# Patient Record
Sex: Female | Born: 1940 | Race: White | Hispanic: No | Smoking: Former smoker
Health system: Southern US, Community
[De-identification: ages and names within clinical notes are randomized; demographics above are authoritative.]

## PROBLEM LIST (undated history)

## (undated) DIAGNOSIS — Z800 Family history of malignant neoplasm of digestive organs: Secondary | ICD-10-CM

## (undated) DIAGNOSIS — I119 Hypertensive heart disease without heart failure: Secondary | ICD-10-CM

## (undated) DIAGNOSIS — I10 Essential (primary) hypertension: Secondary | ICD-10-CM

## (undated) DIAGNOSIS — R011 Cardiac murmur, unspecified: Secondary | ICD-10-CM

## (undated) DIAGNOSIS — I6521 Occlusion and stenosis of right carotid artery: Secondary | ICD-10-CM

## (undated) DIAGNOSIS — Z8719 Personal history of other diseases of the digestive system: Secondary | ICD-10-CM

## (undated) DIAGNOSIS — K219 Gastro-esophageal reflux disease without esophagitis: Secondary | ICD-10-CM

## (undated) DIAGNOSIS — Z8639 Personal history of other endocrine, nutritional and metabolic disease: Secondary | ICD-10-CM

## (undated) DIAGNOSIS — Z803 Family history of malignant neoplasm of breast: Secondary | ICD-10-CM

## (undated) HISTORY — DX: Family history of malignant neoplasm of digestive organs: Z80.0

## (undated) HISTORY — DX: Family history of malignant neoplasm of breast: Z80.3

---

## 2015-11-23 HISTORY — PX: CAROTID ENDARTERECTOMY: SUR193

## 2017-04-30 HISTORY — PX: COLONOSCOPY: SHX174

## 2020-03-02 ENCOUNTER — Other Ambulatory Visit: Payer: Self-pay | Admitting: General Surgery

## 2020-03-02 ENCOUNTER — Ambulatory Visit: Payer: Self-pay | Admitting: General Surgery

## 2020-03-02 DIAGNOSIS — Z170 Estrogen receptor positive status [ER+]: Secondary | ICD-10-CM

## 2020-03-02 DIAGNOSIS — C50411 Malignant neoplasm of upper-outer quadrant of right female breast: Secondary | ICD-10-CM

## 2020-03-03 ENCOUNTER — Other Ambulatory Visit: Payer: Self-pay | Admitting: General Surgery

## 2020-03-03 DIAGNOSIS — Z800 Family history of malignant neoplasm of digestive organs: Secondary | ICD-10-CM

## 2020-03-04 ENCOUNTER — Other Ambulatory Visit: Payer: Self-pay | Admitting: General Surgery

## 2020-03-04 ENCOUNTER — Telehealth: Payer: Self-pay | Admitting: Hematology and Oncology

## 2020-03-04 DIAGNOSIS — C50411 Malignant neoplasm of upper-outer quadrant of right female breast: Secondary | ICD-10-CM

## 2020-03-04 DIAGNOSIS — Z170 Estrogen receptor positive status [ER+]: Secondary | ICD-10-CM

## 2020-03-04 NOTE — Telephone Encounter (Signed)
Received referrals for genetics and med onc for dx of breast cancer. Julia Fernandez has been cld and scheduled to see Dr. Lindi Adie on 12/1 at 1pm an White for genetics at 2pm.

## 2020-03-09 NOTE — Progress Notes (Addendum)
[place] [address] [place] [address] Phone: [phone] Fax: [phone]      Your procedure is scheduled on 03/17/20.  Report to [facility] Main Entrance "A" at 10:30 A.M., and check in at the Admitting office.  Call this number if you have problems the morning of surgery:  [phone]  Call [phone] if you have any questions prior to your surgery date Monday-Friday 8am-4pm    You may drink clear liquids until 9:00am the morning of your surgery.   Clear liquids allowed are: Water, Non-Citrus Juices (without pulp), Carbonated Beverages, Clear Tea, Black Coffee Only, and Gatorade     Take these medicines the morning of surgery with A SIP OF WATER  Amlodipine (norvasc) Metoprolol (lopressor) Rosuvastatin (crestor)  .Follow your surgeon's instructions on when to stop Aspirin.  If no instructions were given by your surgeon then you will need to call the office to get those instructions.     As of today, STOP taking any Aleve, Naproxen, Ibuprofen, Motrin, Advil, Goody's, BC's, all herbal medications, fish oil, and all vitamins.                      Do not wear jewelry, make up, or nail polish            Do not wear lotions, powders, perfumes/colognes, or deodorant.            Do not shave 48 hours prior to surgery.             Do not bring valuables to the hospital.            [facility] is not responsible for any belongings or valuables.  Do NOT Smoke (Tobacco/Vaping) or drink Alcohol 24 hours prior to your procedure If you use a CPAP at night, you may bring all equipment for your overnight stay.   Contacts, glasses, dentures or bridgework may not be worn into surgery.      For patients admitted to the hospital, discharge time will be determined by your treatment team.   Patients discharged the day of surgery will not be allowed to drive home, and someone needs to stay with them for 24  hours.    Special instructions:   [place]- Preparing For Surgery  Before surgery, you can play an important role. Because skin is not sterile, your skin needs to be as free of germs as possible. You can reduce the number of germs on your skin by washing with CHG (chlorahexidine gluconate) Soap before surgery.  CHG is an antiseptic cleaner which kills germs and bonds with the skin to continue killing germs even after washing.    Oral Hygiene is also important to reduce your risk of infection.  Remember - BRUSH YOUR TEETH THE MORNING OF SURGERY WITH YOUR REGULAR TOOTHPASTE  Please do not use if you have an allergy to CHG or antibacterial soaps. If your skin becomes reddened/irritated stop using the CHG.  Do not shave (including legs and underarms) for at least 48 hours prior to first CHG shower. It is OK to shave your face.  Please follow these instructions carefully.   1. Shower the NIGHT BEFORE SURGERY and the MORNING OF SURGERY with CHG Soap.   2. If you chose to wash your hair, wash your hair first as usual with your normal shampoo.  3. After you shampoo, rinse your hair  and body thoroughly to remove the shampoo.  4. Use CHG as you would any other liquid soap. You can apply CHG directly to the skin and wash gently with a scrungie or a clean washcloth.   5. Apply the CHG Soap to your body ONLY FROM THE NECK DOWN.  Do not use on open wounds or open sores. Avoid contact with your eyes, ears, mouth and genitals (private parts). Wash Face and genitals (private parts)  with your normal soap.   6. Wash thoroughly, paying special attention to the area where your surgery will be performed.  7. Thoroughly rinse your body with warm water from the neck down.  8. DO NOT shower/wash with your normal soap after using and rinsing off the CHG Soap.  9. Pat yourself dry with a CLEAN TOWEL.  10. Wear CLEAN PAJAMAS to bed the night before surgery  11. Place CLEAN SHEETS on your bed the night of  your first shower and DO NOT SLEEP WITH PETS.   Day of Surgery: Wear Clean/Comfortable clothing the morning of surgery Do not apply any deodorants/lotions.   Remember to brush your teeth WITH YOUR REGULAR TOOTHPASTE.   Please read over the following fact sheets that you were given.

## 2020-03-10 ENCOUNTER — Other Ambulatory Visit: Payer: Self-pay

## 2020-03-10 ENCOUNTER — Encounter (HOSPITAL_COMMUNITY)
Admission: RE | Admit: 2020-03-10 | Discharge: 2020-03-10 | Disposition: A | Discharge status: Home / Self Care | Payer: Medicare Other | Source: Ambulatory Visit | Attending: General Surgery | Admitting: General Surgery

## 2020-03-10 ENCOUNTER — Encounter (HOSPITAL_COMMUNITY): Payer: Self-pay

## 2020-03-10 DIAGNOSIS — I10 Essential (primary) hypertension: Secondary | ICD-10-CM | POA: Diagnosis not present

## 2020-03-10 DIAGNOSIS — Z87891 Personal history of nicotine dependence: Secondary | ICD-10-CM | POA: Insufficient documentation

## 2020-03-10 DIAGNOSIS — R011 Cardiac murmur, unspecified: Secondary | ICD-10-CM | POA: Diagnosis not present

## 2020-03-10 DIAGNOSIS — C50911 Malignant neoplasm of unspecified site of right female breast: Secondary | ICD-10-CM | POA: Insufficient documentation

## 2020-03-10 DIAGNOSIS — Z7982 Long term (current) use of aspirin: Secondary | ICD-10-CM | POA: Diagnosis not present

## 2020-03-10 DIAGNOSIS — K449 Diaphragmatic hernia without obstruction or gangrene: Secondary | ICD-10-CM | POA: Insufficient documentation

## 2020-03-10 DIAGNOSIS — Z79899 Other long term (current) drug therapy: Secondary | ICD-10-CM | POA: Diagnosis not present

## 2020-03-10 DIAGNOSIS — Z7901 Long term (current) use of anticoagulants: Secondary | ICD-10-CM | POA: Insufficient documentation

## 2020-03-10 DIAGNOSIS — E785 Hyperlipidemia, unspecified: Secondary | ICD-10-CM | POA: Diagnosis not present

## 2020-03-10 DIAGNOSIS — Z01818 Encounter for other preprocedural examination: Secondary | ICD-10-CM | POA: Insufficient documentation

## 2020-03-10 DIAGNOSIS — I6521 Occlusion and stenosis of right carotid artery: Secondary | ICD-10-CM | POA: Diagnosis not present

## 2020-03-10 HISTORY — DX: Personal history of other diseases of the digestive system: Z87.19

## 2020-03-10 HISTORY — DX: Hypertensive heart disease without heart failure: I11.9

## 2020-03-10 HISTORY — DX: Gastro-esophageal reflux disease without esophagitis: K21.9

## 2020-03-10 HISTORY — DX: Occlusion and stenosis of right carotid artery: I65.21

## 2020-03-10 HISTORY — DX: Personal history of other endocrine, nutritional and metabolic disease: Z86.39

## 2020-03-10 HISTORY — DX: Essential (primary) hypertension: I10

## 2020-03-10 HISTORY — DX: Cardiac murmur, unspecified: R01.1

## 2020-03-10 LAB — COMPREHENSIVE METABOLIC PANEL
ALT: 17 U/L (ref 0–44)
AST: 25 U/L (ref 15–41)
Albumin: 4.3 g/dL (ref 3.5–5.0)
Alkaline Phosphatase: 37 U/L — ABNORMAL LOW (ref 38–126)
Anion gap: 13 (ref 5–15)
BUN: 20 mg/dL (ref 8–23)
CO2: 25 mmol/L (ref 22–32)
Calcium: 10 mg/dL (ref 8.9–10.3)
Chloride: 95 mmol/L — ABNORMAL LOW (ref 98–111)
Creatinine, Ser: 1.31 mg/dL — ABNORMAL HIGH (ref 0.44–1.00)
GFR, Estimated: 42 mL/min — ABNORMAL LOW (ref >60)
Glucose, Bld: 120 mg/dL — ABNORMAL HIGH (ref 70–99)
Potassium: 3.2 mmol/L — ABNORMAL LOW (ref 3.5–5.1)
Sodium: 133 mmol/L — ABNORMAL LOW (ref 135–145)
Total Bilirubin: 0.8 mg/dL (ref 0.3–1.2)
Total Protein: 7.3 g/dL (ref 6.5–8.1)

## 2020-03-10 LAB — CBC WITH DIFFERENTIAL/PLATELET
Abs Immature Granulocytes: 0.05 10*3/uL (ref 0.00–0.07)
Basophils Absolute: 0.1 10*3/uL (ref 0.0–0.1)
Basophils Relative: 1 %
Eosinophils Absolute: 0.2 10*3/uL (ref 0.0–0.5)
Eosinophils Relative: 2 %
HCT: 38.1 % (ref 36.0–46.0)
Hemoglobin: 12.9 g/dL (ref 12.0–15.0)
Immature Granulocytes: 1 %
Lymphocytes Relative: 21 %
Lymphs Abs: 1.9 10*3/uL (ref 0.7–4.0)
MCH: 29.6 pg (ref 26.0–34.0)
MCHC: 33.9 g/dL (ref 30.0–36.0)
MCV: 87.4 fL (ref 80.0–100.0)
Monocytes Absolute: 0.8 10*3/uL (ref 0.1–1.0)
Monocytes Relative: 9 %
Neutro Abs: 6 10*3/uL (ref 1.7–7.7)
Neutrophils Relative %: 66 %
Platelets: 281 10*3/uL (ref 150–400)
RBC: 4.36 MIL/uL (ref 3.87–5.11)
RDW: 14.6 % (ref 11.5–15.5)
WBC: 8.9 10*3/uL (ref 4.0–10.5)
nRBC: 0 % (ref 0.0–0.2)

## 2020-03-10 LAB — PROTIME-INR
INR: 1 (ref 0.8–1.2)
Prothrombin Time: 12.7 seconds (ref 11.4–15.2)

## 2020-03-10 NOTE — H&P (Signed)
Julia Fernandez Appointment: 02/29/2020 11:45 AM Location: [facility] Patient #: [id number] DOB: 09/21/1940 Widowed / Language: Cleophus Molt / Race: White Female   History of Present Illness Stark Klein MD; 02/29/2020 12:56 PM) The patient is a 79 year old female who presents with breast cancer. Pt is a 79 yo F who presents with right breast cancer 01/2020. She palpated a mass in late september 2021. Diagnostic imaging showed an 11 mm asymmetrical spiculated density at 12 o'clock around 4 cm from the nipple. 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 She denies breast pain. She hasn't had issues with her breasts before.  She has two sisters who have had breast cancer, a sister with pancreatic cancer, and a niece with colon cancer. She had menarche at age 89 and menopause in her late 25s. She is a G2P2 with first child in early 56s.   Of note, she lives in [place] and her neighbor is one of my patients, Judd Gaudier.   Imaging is from [facility] in [place]. Images and reports reviewed. Disc of images in turned into the Breast Center for seed placement.    Past Surgical History Emeline Gins, [place]; 02/29/2020 12:39 PM) Breast Biopsy  Right. Carotid Artery Surgery  Right. Colon Polyp Removal - Colonoscopy   Diagnostic Studies History Emeline Gins, [place]; 02/29/2020 12:39 PM) Colonoscopy  1-5 years ago Mammogram  within last year Pap Smear  >5 years ago  Allergies Emeline Gins, [place]; 02/29/2020 11:23 AM) Codeine Phosphate *ANALGESICS - OPIOID*  Allergies Reconciled   Medication History Emeline Gins, CMA; 02/29/2020 11:25 AM) hydroCHLOROthiazide (12.5MG  Tablet, Oral) Active. amLODIPine Besylate (10MG  Tablet, Oral) Active. Metoprolol Tartrate (25MG  Tablet, Oral) Active. Aspirin (81MG  Tablet, Oral) Active. Rosuvastatin Calcium (40MG  Tablet, Oral) Active. Losartan Potassium (50MG   Tablet, Oral) Active. Vitamin D (50000U Tablet, Oral) Active. Medications Reconciled  Social History Emeline Gins, [place]; 02/29/2020 12:39 PM) Alcohol use  Occasional alcohol use. Caffeine use  Carbonated beverages, Coffee, Tea. No drug use  Tobacco use  Former smoker.  Family History Emeline Gins, [place]; 02/29/2020 12:39 PM) Breast Cancer  Sister. Colon Cancer  Family Members In General. Hypertension  Mother, Sister. Malignant Neoplasm Of Pancreas  Sister.  Pregnancy / Birth History Emeline Gins, [place]; 02/29/2020 12:39 PM) Age at menarche  33 years. Age of menopause  50-50 Gravida  2 Irregular periods  Maternal age  2-25 Para  2  Other Problems Emeline Gins, [place]; 02/29/2020 12:39 PM) Breast Cancer  Gastroesophageal Reflux Disease  Heart murmur  High blood pressure     Review of Systems Emeline Gins CMA; 02/29/2020 12:39 PM) General Not Present- Appetite Loss, Chills, Fatigue, Fever, Night Sweats, Weight Gain and Weight Loss. Skin Not Present- Change in Wart/Mole, Dryness, Hives, Jaundice, New Lesions, Non-Healing Wounds, Rash and Ulcer. HEENT Not Present- Earache, Hearing Loss, Hoarseness, Nose Bleed, Oral Ulcers, Ringing in the Ears, Seasonal Allergies, Sinus Pain, Sore Throat, Visual Disturbances, Wears glasses/contact lenses and Yellow Eyes. Respiratory Not Present- Bloody sputum, Chronic Cough, Difficulty Breathing, Snoring and Wheezing. Breast Present- Breast Mass. Not Present- Breast Pain, Nipple Discharge and Skin Changes. Cardiovascular Not Present- Chest Pain, Difficulty Breathing Lying Down, Leg Cramps, Palpitations, Rapid Heart Rate, Shortness of Breath and Swelling of Extremities. Gastrointestinal Not Present- Abdominal Pain, Bloating, Bloody Stool, Change in Bowel Habits, Chronic diarrhea, Constipation, Difficulty Swallowing, Excessive gas, Gets full quickly at meals, Hemorrhoids, Indigestion, Nausea, Rectal Pain and  Vomiting. Female Genitourinary  Not Present- Frequency, Nocturia, Painful Urination, Pelvic Pain and Urgency. Musculoskeletal Not Present- Back Pain, Joint Pain, Joint Stiffness, Muscle Pain, Muscle Weakness and Swelling of Extremities. Neurological Not Present- Decreased Memory, Fainting, Headaches, Numbness, Seizures, Tingling, Tremor, Trouble walking and Weakness. Psychiatric Not Present- Anxiety, Bipolar, Change in Sleep Pattern, Depression, Fearful and Frequent crying. Endocrine Not Present- Cold Intolerance, Excessive Hunger, Hair Changes, Heat Intolerance, Hot flashes and New Diabetes. Hematology Not Present- Blood Thinners, Easy Bruising, Excessive bleeding, Gland problems, HIV and Persistent Infections.  Vitals Emeline Gins CMA; 02/29/2020 11:23 AM) 02/29/2020 11:22 AM Weight: 148.2 lb Height: 63in Body Surface Area: 1.7 m Body Mass Index: 26.25 kg/m  Temp.: 66F  Pulse: 82 (Regular)  BP: 140/78(Sitting, Left Arm, Standard)       Physical Exam Stark Klein MD; 02/29/2020 12:57 PM) General Mental Status-Alert. General Appearance-Consistent with stated age. Hydration-Well hydrated. Voice-Normal.  Head and Neck Head-normocephalic, atraumatic with no lesions or palpable masses. Trachea-midline. Thyroid Gland Characteristics - normal size and consistency.  Eye Eyeball - Bilateral-Extraocular movements intact. Sclera/Conjunctiva - Bilateral-No scleral icterus.  Chest and Lung Exam Chest and lung exam reveals -quiet, even and easy respiratory effort with no use of accessory muscles and on auscultation, normal breath sounds, no adventitious sounds and normal vocal resonance. Inspection Chest Wall - Normal. Back - normal.  Breast Note: breasts are relatively symmetric other than the right breast is slightly larger. no nipple retraction is present. No lymphedema is seen. no LAD. no skin dimpling. Right breast has a vaguely palpable density  when laying down, but this is not clearly identifiable while sitting up.   Cardiovascular Cardiovascular examination reveals -normal heart sounds, regular rate and rhythm with no murmurs and normal pedal pulses bilaterally.  Abdomen Inspection Inspection of the abdomen reveals - No Hernias. Palpation/Percussion Palpation and Percussion of the abdomen reveal - Soft, Non Tender, No Rebound tenderness, No Rigidity (guarding) and No hepatosplenomegaly. Auscultation Auscultation of the abdomen reveals - Bowel sounds normal.  Neurologic Neurologic evaluation reveals -alert and oriented x 3 with no impairment of recent or remote memory. Mental Status-Normal.  Musculoskeletal Global Assessment -Note: no gross deformities.  Normal Exam - Left-Upper Extremity Strength Normal and Lower Extremity Strength Normal. Normal Exam - Right-Upper Extremity Strength Normal and Lower Extremity Strength Normal.  Lymphatic Head & Neck  General Head & Neck Lymphatics: Bilateral - Description - Normal. Axillary  General Axillary Region: Bilateral - Description - Normal. Tenderness - Non Tender. Femoral & Inguinal  Generalized Femoral & Inguinal Lymphatics: Bilateral - Description - No Generalized lymphadenopathy.    Assessment & Plan Stark Klein MD; 02/29/2020 12:59 PM) MALIGNANT NEOPLASM OF UPPER-OUTER QUADRANT OF RIGHT BREAST IN FEMALE, ESTROGEN RECEPTOR POSITIVE (C50.411) Impression: Pt is a good candidate for right lumpectomy with sentinel node biopsy. Given that the mass is only vaguely palpable, I will request seed placement pre op.  She and her daughter are going to consider whether they want to see oncology and radiation in [place] or closer to home.  The surgical procedure was described to the patient. I discussed the incision type and location and that we would need radiology involved on with a wire or seed marker and/or sentinel node.  The risks and benefits of the procedure  were described to the patient and she wishes to proceed.  We discussed the risks bleeding, infection, damage to other structures, need for further procedures/surgeries. We discussed the risk of seroma. The patient was advised if the area in the breast in cancer,  we may need to go back to surgery for additional tissue to obtain negative margins or for a lymph node biopsy. The patient was advised that these are the most common complications, but that others can occur as well. They were advised against taking aspirin or other anti-inflammatory agents/blood thinners the week before surgery. Current Plans Pt Education - flb breast cancer surgery: discussed with patient and provided information. You are being scheduled for surgery- Our schedulers will call you.  You should hear from our office's scheduling department within 5 working days about the location, date, and time of surgery. We try to make accommodations for patient's preferences in scheduling surgery, but sometimes the OR schedule or the surgeon's schedule prevents [place] from making those accommodations.  If you have not heard from our office [phone]) in 5 working days, call the office and ask for your surgeon's nurse.  If you have other questions about your diagnosis, plan, or surgery, call the office and ask for your surgeon's nurse.  FAMILY HISTORY OF PANCREATIC CANCER (Z80.0) Impression: Given first degree relative with pancreatic cancer, I will get cross sectional imaging. Current Plans MR ABDOMEN WO/W CON ([id number]) (Sister with pancreatic cancer) FAMILY HISTORY OF BREAST CANCER (Z80.3) Impression: Refer for genetic counseling. Current Plans Referred to Genetic Counseling, for evaluation and follow up (Medical Genetics). Routine.   Signed electronically by Stark Klein, MD (02/29/2020 12:59 PM)

## 2020-03-10 NOTE — Progress Notes (Signed)
PCP: Dr. Quillian Quince Addis--saw this month Cardiologist: Currently sees Dr. Sabra Heck in [place], [place]  EKG: Today CXR: n/a ECHO: within the last year and a half--faxed request of records to Dr. Sabra Heck and Dr. Alroy Dust as pt can't remember who performed her ECHO Stress Test: denies Cardiac Cath: denies  ASA: told to stop 5 day prior to surgery  Patient denies shortness of breath, fever, cough, and chest pain at PAT appointment.  Patient verbalized understanding of instructions provided today at the PAT appointment.  Patient asked to review instructions at home and day of surgery.   Called Ebony Hail PA-C with anesthesia during appt--does not need to see today during appt, will follow-up with records once they arrive--also made aware of elevated blood pressure. Pt stated she did take her morning meds today and normally runs between 092-330 systolic at home.

## 2020-03-11 ENCOUNTER — Encounter (HOSPITAL_COMMUNITY): Payer: Self-pay

## 2020-03-11 NOTE — Progress Notes (Addendum)
Anesthesia Chart Review:  Case: [id number] Date/Time: 03/17/20 1215   Procedure: RIGHT BREAST LUMPECTOMY WITH RADIOACTIVE SEED AND SENTINEL LYMPH NODE BIOPSY (Right Breast) - PEC BLOCK, RNFA   Anesthesia type: General   Pre-op diagnosis: RIGHT BREAST CANCER   Location: [place] OR ROOM 09 / [place] OR   Surgeons: Stark Klein, MD      DISCUSSION: Patient is a 79 year old female scheduled for the above procedure.  History includes former smoker (quit 03/11/79), HTN, HLD, murmur, GERD, hiatal hernia, carotid artery stenosis (s/p right carotid endarterectomy 11/23/15), right breast cancer.   BP elevated at PAT. 178/72 --> 190/65. She reports she had taken BP medications that morning, but reports home SBP readings run ~ 140-150's. She denied chest pain and SOB. She was advised to recheck her BP at home and then periodically between now and surgery and contact her PCP or cardiologist if persistently elevated readings.   She reported history of murmur with ongoing cardiology follow-up and echo done within the past 2 years.  Reportedly saw the NP at [facility] within the last few months. Records requested, but are pending. She denied chest pain, cough, fever, SOB.   She is to told ASA 5 days prior to surgery.  Presurgical COVID-19 test is scheduled for 03/14/20. RSL is scheduled for 03/16/20.   Chart will be left for follow-up cardiology records.    ADDENDUM 03/14/20 11:30 AM: [facility] records received. She was last seen there by Carrolyn Leigh, MD in 12/16/15 for HTN follow-up. 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 She is now followed at [facility], and those records are still pending.    ADDENDUM 03/15/20 1:01 PM: 2020 echo and 09/30/19 EKG received from Dr. Ammie Ferrier office. 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 Office note from 01/06/20 reviewed. BP 170/68 with recheck 158/68. Notes  suggests recent increase in amlodipine, and addition of HCTZ. I called patient. Home BP reading since her PAT visit was 140/67.  She has Ativan to take as needed and questioned whether she could take this on the morning of surgery--reported in the past it did not make her drowsy or incoherent and that she would not be driving herself anyway. Discussed if patient with active prescription then may take as prescribed with sips of water and should notify staff of the day of surgery of last dose. 03/04/20 COVID-19 test negative.    VS: BP (!) 190/65 Comment: Toni Arthurs made aware- see note  Pulse 68   Temp 36.6 C (Oral)   Resp 18   Ht 5\' 3"  (1.6 m)   Wt 66.8 kg   SpO2 100%   BMI 26.09 kg/m  BP 178/72 --> 190/65. BP on 02/29/20 140/78 (per Dr. Barry Dienes note).   PROVIDERSMichell Heinrich, DO is PCP [phone]) - Orpah Greek, MD is cardiologist ([facility]), previously she saw Cleora Fleet, MD with [facility].  Maura Crandall, MD is vascular surgeon Terald Sleeper). As needed follow-up recommended at 12/23/15 post-op right CEA visit.    LABS: Labs reviewed: Acceptable for surgery. (all labs ordered are listed, but only abnormal results are displayed)  Labs Reviewed  COMPREHENSIVE METABOLIC PANEL - Abnormal; Notable for the following components:      Result Value   Sodium 133 (*)    Potassium 3.2 (*)    Chloride 95 (*)    Glucose, Bld 120 (*)  Creatinine, Ser 1.31 (*)    Alkaline Phosphatase 37 (*)    GFR, Estimated 42 (*)    All other components within normal limits  CBC WITH DIFFERENTIAL/PLATELET  PROTIME-INR    EKG: 03/10/20: Normal sinus rhythm Left ventricular hypertrophy with repolarization abnormality ( R in aVL , Cornell product ) Abnormal ECG No old tracing to compare Confirmed by Charolette Forward ([id number]) on 03/10/2020 9:48:33 PM - Overall, EKG is stable when compared to 09/30/19 tracing from [facility].     CV: Echo 05/19/18 ([facility]) Conclusions: Normal left ventricular systolic function.  LVEF 55 to 60%. Normal right ventricular function. Moderate left ventricular hypertrophy. Normal diastolic function. Cardiac chamber imaging demonstrate mild left atrial enlargement, mild right ventricular enlargement. Mild aortic stenosis, mild to moderate aortic insufficiency.  Aortic valve area 1.58 cm.  Grossly normal aortic valve morphology.  Echodense with decreased excursion.  Peak aortic velocity 270.3 cm/s.  Aortic VTI 57.65 cm.  Peak LVOT velocity 168.4 cm/s.  LVOT VTI 37.13 cm.  Peak aortic gradient 29.5 mmHg.  Mean aortic gradient 13.6 mmHg. Mild to moderate mitral regurgitation. Normal estimated PA systolic pressure. Estimated RVSP 20.6 mmHg. No pericardial effusion. No other significant findings. Comparison: No significant change compared to prior study. Recommendations: Repeat study is recommended in 1 to 2 years.  Additional studies as outlined in 12/07/15 notation by Dr. Gibson Ramp [facility]): - 12/07/15 echo showed: EF 60-65%, mild-to-moderate left atrial dilatation, calcified aortic valve with mild aortic stenosis, mild-to-moderate aortic insufficiency, mild-to-moderate MR.  - Prior to right CEA, 08/18/15 carotid US showed: > 70 % RICA stenosis and 26-94% LICA stenosis with 8/54/62 CTA carotids showing 90% right carotid bulb and proximal RICA artery stenosis and approximately 50% left caroitd bulb and proximal LICA stenosis. S/p right CEA 11/23/15.  She denied prior stress test or cardiac cath.   Past Medical History:  Diagnosis Date  . Asymptomatic stenosis of right carotid artery   . Benign hypertensive heart disease   . GERD (gastroesophageal reflux disease)   . Heart murmur   . History of hiatal hernia   . History of hyperlipidemia   . Hypertension     Past Surgical History:  Procedure Laterality Date  . CAROTID ENDARTERECTOMY Right 11/23/2015  . COLONOSCOPY   2019    MEDICATIONS: . amLODipine (NORVASC) 5 MG tablet  . aspirin EC 81 MG tablet  . Cholecalciferol (VITAMIN D3 SUPER STRENGTH) 50 MCG (2000 UT) TABS  . hydrochlorothiazide (HYDRODIURIL) 12.5 MG tablet  . losartan (COZAAR) 100 MG tablet  . metoprolol tartrate (LOPRESSOR) 25 MG tablet  . rosuvastatin (CRESTOR) 20 MG tablet   No current facility-administered medications for this encounter.    Myra Gianotti, PA-C Surgical Short Stay/Anesthesiology [facility] Phone [phone] [facility] Phone [phone] 03/11/2020 4:33 PM

## 2020-03-14 ENCOUNTER — Other Ambulatory Visit: Payer: Self-pay

## 2020-03-14 ENCOUNTER — Other Ambulatory Visit (HOSPITAL_COMMUNITY)
Admission: RE | Admit: 2020-03-14 | Discharge: 2020-03-14 | Disposition: A | Discharge status: Home / Self Care | Payer: Medicare Other | Source: Ambulatory Visit | Attending: General Surgery | Admitting: General Surgery

## 2020-03-14 DIAGNOSIS — Z20822 Contact with and (suspected) exposure to covid-19: Secondary | ICD-10-CM | POA: Insufficient documentation

## 2020-03-14 DIAGNOSIS — Z01812 Encounter for preprocedural laboratory examination: Secondary | ICD-10-CM | POA: Insufficient documentation

## 2020-03-14 LAB — SARS CORONAVIRUS 2 (TAT 6-24 HRS): SARS Coronavirus 2: NEGATIVE

## 2020-03-15 ENCOUNTER — Encounter (HOSPITAL_COMMUNITY): Payer: Self-pay

## 2020-03-15 NOTE — Anesthesia Preprocedure Evaluation (Addendum)
Anesthesia Evaluation  Patient identified by MRN, date of birth, ID band Patient awake    Reviewed: Allergy & Precautions, H&P , NPO status , Patient's Chart, lab work & pertinent test results, reviewed documented beta blocker date and time   Airway Mallampati: II  TM Distance: >3 FB Neck ROM: full    Dental no notable dental hx. (+) Poor Dentition, Missing, Partial Lower, Partial Upper   Pulmonary neg pulmonary ROS, former smoker,    Pulmonary exam normal breath sounds clear to auscultation       Cardiovascular Exercise Tolerance: Good hypertension, Pt. on medications + Valvular Problems/Murmurs AS  Rhythm:regular Rate:Normal   EKG: 03/10/20: Normal sinus rhythm Left ventricular hypertrophy with repolarization abnormality ( R in aVL , Cornell product ) Abnormal ECG No old tracing to compare Confirmed by Charolette Forward ([id number]) on 03/10/2020 9:48:33 PM - Overall, EKG is stable when compared to 09/30/19 tracing from [facility].   Echo 05/19/18 Normal left ventricular systolic function.  LVEF 55 to 60%. Normal right ventricular function. Moderate left ventricular hypertrophy. Normal diastolic function. Cardiac chamber imaging demonstrate mild left atrial enlargement, mild right ventricular enlargement. Mild aortic stenosis, mild to moderate aortic insufficiency.  Aortic valve area 1.58 cm.  Grossly normal aortic valve morphology.  Echodense with decreased excursion.  Peak aortic velocity 270.3 cm/s.  Aortic VTI 57.65 cm.  Peak LVOT velocity 168.4 cm/s.  LVOT VTI 37.13 cm.  Peak aortic gradient 29.5 mmHg.  Mean aortic gradient 13.6 mmHg. Mild to moderate mitral regurgitation. Normal estimated PA systolic pressure. Estimated RVSP 20.6 mmHg.     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 S/p right CEA 11/23/15. negative neurological ROS  negative psych ROS   GI/Hepatic Neg liver ROS, hiatal hernia, GERD  ,  Endo/Other  negative endocrine ROS  Renal/GU negative Renal ROS  negative genitourinary   Musculoskeletal   Abdominal   Peds  Hematology negative hematology ROS (+)   Anesthesia Other Findings   Reproductive/Obstetrics negative OB ROS                           Anesthesia Physical Anesthesia Plan  ASA: III  Anesthesia Plan: General   Post-op Pain Management: GA combined w/ Regional for post-op pain   Induction: Intravenous  PONV Risk Score and Plan: 3 and Ondansetron, Dexamethasone and Treatment may vary due to age or medical condition  Airway Management Planned: Oral ETT and LMA  Additional Equipment:   Intra-op Plan:   Post-operative Plan: Extubation in OR  Informed Consent: I have reviewed the patients History and Physical, chart, labs and discussed the procedure including the risks, benefits and alternatives for the proposed anesthesia with the patient or authorized representative who has indicated his/her understanding and acceptance.     Dental Advisory Given  Plan Discussed with: CRNA and Anesthesiologist  Anesthesia Plan Comments: (PAT note written by Myra Gianotti, PA-C. )     Anesthesia Quick Evaluation

## 2020-03-16 ENCOUNTER — Ambulatory Visit
Admission: RE | Admit: 2020-03-16 | Discharge: 2020-03-16 | Disposition: A | Discharge status: Home / Self Care | Payer: Medicare Other | Source: Ambulatory Visit | Attending: General Surgery | Admitting: General Surgery

## 2020-03-16 ENCOUNTER — Other Ambulatory Visit: Payer: Self-pay

## 2020-03-16 ENCOUNTER — Other Ambulatory Visit: Payer: Self-pay | Admitting: General Surgery

## 2020-03-16 DIAGNOSIS — Z170 Estrogen receptor positive status [ER+]: Secondary | ICD-10-CM

## 2020-03-16 DIAGNOSIS — C50411 Malignant neoplasm of upper-outer quadrant of right female breast: Secondary | ICD-10-CM

## 2020-03-17 ENCOUNTER — Other Ambulatory Visit: Payer: Self-pay

## 2020-03-17 ENCOUNTER — Encounter (HOSPITAL_COMMUNITY)
Admission: RE | Disposition: A | Discharge status: Home / Self Care | Payer: Self-pay | Source: Ambulatory Visit | Attending: General Surgery

## 2020-03-17 ENCOUNTER — Encounter (HOSPITAL_COMMUNITY): Payer: Self-pay | Admitting: General Surgery

## 2020-03-17 ENCOUNTER — Ambulatory Visit (HOSPITAL_COMMUNITY)
Admission: RE | Admit: 2020-03-17 | Discharge: 2020-03-17 | Disposition: A | Discharge status: Home / Self Care | Payer: Medicare Other | Source: Ambulatory Visit | Attending: General Surgery | Admitting: General Surgery

## 2020-03-17 ENCOUNTER — Ambulatory Visit (HOSPITAL_COMMUNITY): Payer: Medicare Other | Admitting: Vascular Surgery

## 2020-03-17 ENCOUNTER — Ambulatory Visit
Admission: RE | Admit: 2020-03-17 | Discharge: 2020-03-17 | Disposition: A | Discharge status: Home / Self Care | Payer: Medicare Other | Source: Ambulatory Visit | Attending: General Surgery | Admitting: General Surgery

## 2020-03-17 ENCOUNTER — Ambulatory Visit (HOSPITAL_COMMUNITY): Payer: Medicare Other | Admitting: Anesthesiology

## 2020-03-17 DIAGNOSIS — Z87891 Personal history of nicotine dependence: Secondary | ICD-10-CM | POA: Insufficient documentation

## 2020-03-17 DIAGNOSIS — Z170 Estrogen receptor positive status [ER+]: Secondary | ICD-10-CM

## 2020-03-17 DIAGNOSIS — Z803 Family history of malignant neoplasm of breast: Secondary | ICD-10-CM | POA: Diagnosis not present

## 2020-03-17 DIAGNOSIS — Z800 Family history of malignant neoplasm of digestive organs: Secondary | ICD-10-CM | POA: Insufficient documentation

## 2020-03-17 DIAGNOSIS — C50911 Malignant neoplasm of unspecified site of right female breast: Secondary | ICD-10-CM | POA: Insufficient documentation

## 2020-03-17 DIAGNOSIS — C50411 Malignant neoplasm of upper-outer quadrant of right female breast: Secondary | ICD-10-CM

## 2020-03-17 HISTORY — PX: BREAST LUMPECTOMY WITH RADIOACTIVE SEED AND SENTINEL LYMPH NODE BIOPSY: SHX6550

## 2020-03-17 SURGERY — BREAST LUMPECTOMY WITH RADIOACTIVE SEED AND SENTINEL LYMPH NODE BIOPSY
Anesthesia: General | Site: Breast | Laterality: Right

## 2020-03-17 MED ORDER — DEXAMETHASONE SODIUM PHOSPHATE 10 MG/ML IJ SOLN
INTRAMUSCULAR | Status: AC
  Filled 2020-03-17: qty 2

## 2020-03-17 MED ORDER — DEXAMETHASONE SODIUM PHOSPHATE 10 MG/ML IJ SOLN
INTRAMUSCULAR | Status: DC | PRN
  Administered 2020-03-17: 4 mg
  Administered 2020-03-17: 10 mg

## 2020-03-17 MED ORDER — MIDAZOLAM HCL 2 MG/2ML IJ SOLN: 2.0000 mg | Freq: Once | INTRAMUSCULAR | Status: AC

## 2020-03-17 MED ORDER — CHLORHEXIDINE GLUCONATE CLOTH 2 % EX PADS: 6.0000 | MEDICATED_PAD | Freq: Once | CUTANEOUS | Status: DC

## 2020-03-17 MED ORDER — MIDAZOLAM HCL 2 MG/2ML IJ SOLN
INTRAMUSCULAR | Status: AC
  Administered 2020-03-17: 2 mg via INTRAVENOUS
  Filled 2020-03-17: qty 2

## 2020-03-17 MED ORDER — LIDOCAINE-EPINEPHRINE 1 %-1:100000 IJ SOLN
INTRAMUSCULAR | Status: AC
  Filled 2020-03-17: qty 1

## 2020-03-17 MED ORDER — BUPIVACAINE HCL (PF) 0.25 % IJ SOLN
INTRAMUSCULAR | Status: AC
  Filled 2020-03-17: qty 30

## 2020-03-17 MED ORDER — METHYLENE BLUE 0.5 % INJ SOLN
INTRAVENOUS | Status: AC
  Filled 2020-03-17: qty 10

## 2020-03-17 MED ORDER — SUCCINYLCHOLINE CHLORIDE 200 MG/10ML IV SOSY
PREFILLED_SYRINGE | INTRAVENOUS | Status: AC
  Filled 2020-03-17: qty 10

## 2020-03-17 MED ORDER — ACETAMINOPHEN 500 MG PO TABS: 1000.0000 mg | ORAL_TABLET | ORAL | Status: AC

## 2020-03-17 MED ORDER — SODIUM CHLORIDE (PF) 0.9 % IJ SOLN
INTRAMUSCULAR | Status: AC
  Filled 2020-03-17: qty 10

## 2020-03-17 MED ORDER — ORAL CARE MOUTH RINSE: 15.0000 mL | Freq: Once | OROMUCOSAL | Status: AC

## 2020-03-17 MED ORDER — OXYCODONE HCL 5 MG PO TABS: 2.5000 mg | ORAL_TABLET | Freq: Four times a day (QID) | ORAL | 0 refills | Status: DC | PRN

## 2020-03-17 MED ORDER — EPHEDRINE 5 MG/ML INJ
INTRAVENOUS | Status: AC
  Filled 2020-03-17: qty 10

## 2020-03-17 MED ORDER — ROPIVACAINE HCL 7.5 MG/ML IJ SOLN
INTRAMUSCULAR | Status: DC | PRN
  Administered 2020-03-17: 30 mL via PERINEURAL

## 2020-03-17 MED ORDER — ONDANSETRON HCL 4 MG/2ML IJ SOLN
INTRAMUSCULAR | Status: AC
  Filled 2020-03-17: qty 4

## 2020-03-17 MED ORDER — PROPOFOL 10 MG/ML IV BOLUS
INTRAVENOUS | Status: DC | PRN
  Administered 2020-03-17: 200 mg via INTRAVENOUS

## 2020-03-17 MED ORDER — ACETAMINOPHEN 500 MG PO TABS
ORAL_TABLET | ORAL | Status: AC
  Administered 2020-03-17: 1000 mg via ORAL
  Filled 2020-03-17: qty 2

## 2020-03-17 MED ORDER — CEFAZOLIN SODIUM-DEXTROSE 2-4 GM/100ML-% IV SOLN
INTRAVENOUS | Status: AC
  Filled 2020-03-17: qty 100

## 2020-03-17 MED ORDER — EPHEDRINE SULFATE-NACL 50-0.9 MG/10ML-% IV SOSY
PREFILLED_SYRINGE | INTRAVENOUS | Status: DC | PRN
  Administered 2020-03-17 (×2): 10 mg via INTRAVENOUS

## 2020-03-17 MED ORDER — ROCURONIUM BROMIDE 10 MG/ML (PF) SYRINGE
PREFILLED_SYRINGE | INTRAVENOUS | Status: AC
  Filled 2020-03-17: qty 10

## 2020-03-17 MED ORDER — PROPOFOL 10 MG/ML IV BOLUS
INTRAVENOUS | Status: AC
  Filled 2020-03-17: qty 20

## 2020-03-17 MED ORDER — BUPIVACAINE HCL (PF) 0.25 % IJ SOLN
INTRAMUSCULAR | Status: DC | PRN
  Administered 2020-03-17: 20 mL

## 2020-03-17 MED ORDER — ONDANSETRON HCL 4 MG/2ML IJ SOLN
INTRAMUSCULAR | Status: AC
  Filled 2020-03-17: qty 2

## 2020-03-17 MED ORDER — FENTANYL CITRATE (PF) 250 MCG/5ML IJ SOLN
INTRAMUSCULAR | Status: DC | PRN
  Administered 2020-03-17 (×3): 25 ug via INTRAVENOUS

## 2020-03-17 MED ORDER — DEXAMETHASONE SODIUM PHOSPHATE 10 MG/ML IJ SOLN
INTRAMUSCULAR | Status: AC
  Filled 2020-03-17: qty 1

## 2020-03-17 MED ORDER — 0.9 % SODIUM CHLORIDE (POUR BTL) OPTIME
TOPICAL | Status: DC | PRN
  Administered 2020-03-17: 1000 mL

## 2020-03-17 MED ORDER — LIDOCAINE-EPINEPHRINE 1 %-1:100000 IJ SOLN
INTRAMUSCULAR | Status: DC | PRN
  Administered 2020-03-17: 20 mL

## 2020-03-17 MED ORDER — LACTATED RINGERS IV SOLN: INTRAVENOUS | Status: DC

## 2020-03-17 MED ORDER — CHLORHEXIDINE GLUCONATE 0.12 % MT SOLN
OROMUCOSAL | Status: AC
  Administered 2020-03-17: 15 mL via OROMUCOSAL
  Filled 2020-03-17: qty 15

## 2020-03-17 MED ORDER — GLYCOPYRROLATE PF 0.2 MG/ML IJ SOSY
PREFILLED_SYRINGE | INTRAMUSCULAR | Status: AC
  Filled 2020-03-17: qty 1

## 2020-03-17 MED ORDER — CHLORHEXIDINE GLUCONATE 0.12 % MT SOLN: 15.0000 mL | Freq: Once | OROMUCOSAL | Status: AC

## 2020-03-17 MED ORDER — FENTANYL CITRATE (PF) 100 MCG/2ML IJ SOLN
INTRAMUSCULAR | Status: AC
  Administered 2020-03-17: 100 ug via INTRAVENOUS
  Filled 2020-03-17: qty 2

## 2020-03-17 MED ORDER — ONDANSETRON HCL 4 MG/2ML IJ SOLN
INTRAMUSCULAR | Status: DC | PRN
  Administered 2020-03-17: 4 mg via INTRAVENOUS

## 2020-03-17 MED ORDER — THROMBIN (RECOMBINANT) 20000 UNITS EX SOLR
CUTANEOUS | Status: AC
  Filled 2020-03-17: qty 20000

## 2020-03-17 MED ORDER — FENTANYL CITRATE (PF) 100 MCG/2ML IJ SOLN: 100.0000 ug | Freq: Once | INTRAMUSCULAR | Status: AC

## 2020-03-17 MED ORDER — CEFAZOLIN SODIUM-DEXTROSE 2-4 GM/100ML-% IV SOLN
2.0000 g | INTRAVENOUS | Status: AC
  Administered 2020-03-17: 2 g via INTRAVENOUS

## 2020-03-17 MED ORDER — GLYCOPYRROLATE 0.2 MG/ML IJ SOLN
INTRAMUSCULAR | Status: DC | PRN
  Administered 2020-03-17: .2 mg via INTRAVENOUS

## 2020-03-17 MED ORDER — FENTANYL CITRATE (PF) 250 MCG/5ML IJ SOLN
INTRAMUSCULAR | Status: AC
  Filled 2020-03-17: qty 5

## 2020-03-17 SURGICAL SUPPLY — 45 items
BINDER BREAST LRG (GAUZE/BANDAGES/DRESSINGS) IMPLANT
BINDER BREAST XLRG (GAUZE/BANDAGES/DRESSINGS) ×3 IMPLANT
BNDG COHESIVE 4X5 TAN STRL (GAUZE/BANDAGES/DRESSINGS) ×3 IMPLANT
CANISTER SUCT 3000ML PPV (MISCELLANEOUS) ×3 IMPLANT
CHLORAPREP W/TINT 26 (MISCELLANEOUS) ×3 IMPLANT
CLIP VESOCCLUDE LG 6/CT (CLIP) ×3 IMPLANT
CLIP VESOCCLUDE MED 6/CT (CLIP) ×3 IMPLANT
CLIP VESOCCLUDE SM WIDE 6/CT (CLIP) ×3 IMPLANT
CLOSURE STERI-STRIP 1/2X4 (GAUZE/BANDAGES/DRESSINGS) ×1
CLSR STERI-STRIP ANTIMIC 1/2X4 (GAUZE/BANDAGES/DRESSINGS) ×2 IMPLANT
CNTNR URN SCR LID CUP LEK RST (MISCELLANEOUS) IMPLANT
CONT SPEC 4OZ STRL OR WHT (MISCELLANEOUS)
COVER PROBE W GEL 5X96 (DRAPES) ×3 IMPLANT
COVER SURGICAL LIGHT HANDLE (MISCELLANEOUS) ×3 IMPLANT
DERMABOND ADVANCED (GAUZE/BANDAGES/DRESSINGS) ×2
DERMABOND ADVANCED .7 DNX12 (GAUZE/BANDAGES/DRESSINGS) ×1 IMPLANT
DEVICE DUBIN SPECIMEN MAMMOGRA (MISCELLANEOUS) ×3 IMPLANT
DRAPE CHEST BREAST 15X10 FENES (DRAPES) ×3 IMPLANT
ELECT COATED BLADE 2.86 ST (ELECTRODE) ×3 IMPLANT
ELECT NEEDLE BLADE 2-5/6 (NEEDLE) ×3 IMPLANT
ELECT REM PT RETURN 9FT ADLT (ELECTROSURGICAL) ×3
ELECTRODE REM PT RTRN 9FT ADLT (ELECTROSURGICAL) ×1 IMPLANT
GAUZE SPONGE 4X4 12PLY STRL (GAUZE/BANDAGES/DRESSINGS) ×3 IMPLANT
GLOVE BIO SURGEON STRL SZ 6 (GLOVE) ×3 IMPLANT
GLOVE INDICATOR 6.5 STRL GRN (GLOVE) ×3 IMPLANT
GOWN STRL REUS W/ TWL LRG LVL3 (GOWN DISPOSABLE) ×1 IMPLANT
GOWN STRL REUS W/TWL 2XL LVL3 (GOWN DISPOSABLE) ×3 IMPLANT
GOWN STRL REUS W/TWL LRG LVL3 (GOWN DISPOSABLE) ×2
KIT BASIN OR (CUSTOM PROCEDURE TRAY) ×3 IMPLANT
KIT MARKER MARGIN INK (KITS) ×3 IMPLANT
LIGHT WAVEGUIDE WIDE FLAT (MISCELLANEOUS) ×3 IMPLANT
NEEDLE 18GX1X1/2 (RX/OR ONLY) (NEEDLE) IMPLANT
NEEDLE FILTER BLUNT 18X 1/2SAF (NEEDLE)
NEEDLE FILTER BLUNT 18X1 1/2 (NEEDLE) IMPLANT
NEEDLE HYPO 25GX1X1/2 BEV (NEEDLE) ×3 IMPLANT
NS IRRIG 1000ML POUR BTL (IV SOLUTION) ×3 IMPLANT
PACK GENERAL/GYN (CUSTOM PROCEDURE TRAY) ×3 IMPLANT
PACK UNIVERSAL I (CUSTOM PROCEDURE TRAY) ×3 IMPLANT
PAD ABD 8X10 STRL (GAUZE/BANDAGES/DRESSINGS) ×3 IMPLANT
STOCKINETTE IMPERVIOUS 9X36 MD (GAUZE/BANDAGES/DRESSINGS) ×3 IMPLANT
SUT MNCRL AB 4-0 PS2 18 (SUTURE) ×3 IMPLANT
SUT VIC AB 3-0 SH 8-18 (SUTURE) ×3 IMPLANT
SYR CONTROL 10ML LL (SYRINGE) ×3 IMPLANT
TOWEL GREEN STERILE (TOWEL DISPOSABLE) ×3 IMPLANT
TOWEL GREEN STERILE FF (TOWEL DISPOSABLE) ×3 IMPLANT

## 2020-03-17 NOTE — Op Note (Signed)
Right Breast Radioactive seed localized lumpectomy and sentinel lymph node biopsy  Indications: This patient presents with history of right breast cancer, grade 2 invasive ductal carcinoma UOQ, cT1cN0, +/+/-, Ki 67 5%  Pre-operative Diagnosis: right breast cancer  Post-operative Diagnosis: Same  Surgeon: Stark Klein   Anesthesia: General endotracheal anesthesia  ASA Class: 3  Procedure Details  The patient was seen in the Holding Room. The risks, benefits, complications, treatment options, and expected outcomes were discussed with the patient. The possibilities of bleeding, infection, the need for additional procedures, failure to diagnose a condition, and creating a complication requiring transfusion or operation were discussed with the patient. The patient concurred with the proposed plan, giving informed consent.  The site of surgery properly noted/marked. The patient was taken to Operating Room # 9, identified, and the procedure verified as Right Breast seed localized Lumpectomy with sentinel lymph node biopsy. A Time Out was held and the above information confirmed.  The right arm, breast, and chest were prepped and draped in standard fashion. The lumpectomy was performed by creating a superior circumareolar incision near the previously placed radioactive seed.  Dissection was carried down to around the point of maximum signal intensity. The cautery was used to perform the dissection.  Hemostasis was achieved with cautery. The edges of the cavity were marked with large clips, with one each medial, lateral, inferior and superior, and two clips posteriorly.   The specimen was inked with the margin marker paint kit.    Specimen radiography confirmed inclusion of the mammographic lesion, the clip, and the seed.  The background signal in the breast was zero.  The wound was irrigated and closed with 3-0 vicryl in layers and 4-0 monocryl subcuticular suture.    Using a hand-held gamma probe, right  axillary sentinel nodes were identified transcutaneously.  An oblique incision was created below the axillary hairline.  Dissection was carried through the clavipectoral fascia.  Four deep level 2 axillary sentinel nodes were removed.  Counts per second were 12, 6800, 320, and 290.    The background count was 19 cps.  The wound was irrigated.  Hemostasis was achieved with cautery.  The axillary incision was closed with a 3-0 vicryl deep dermal interrupted sutures and a 4-0 monocryl subcuticular closure.    Sterile dressings were applied. At the end of the operation, all sponge, instrument, and needle counts were correct.  Findings: grossly clear surgical margins and no adenopathy.  Posterior margin is pectoralis.    Estimated Blood Loss:  min         Specimens: right breast lumpectomy with seed and four right axillary sentinel lymph nodes.             Complications:  None; patient tolerated the procedure well.         Disposition: PACU - hemodynamically stable.         Condition: stable

## 2020-03-17 NOTE — Discharge Instructions (Signed)
[facility] °Office Phone Number [phone] ° °BREAST BIOPSY/ PARTIAL MASTECTOMY: POST OP INSTRUCTIONS ° °Always review your discharge instruction sheet given to you by the facility where your surgery was performed. ° °IF YOU HAVE DISABILITY OR FAMILY LEAVE FORMS, YOU MUST BRING THEM TO THE OFFICE FOR PROCESSING.  DO NOT GIVE THEM TO YOUR DOCTOR. ° °1. A prescription for pain medication may be given to you upon discharge.  Take your pain medication as prescribed, if needed.  If narcotic pain medicine is not needed, then you may take acetaminophen (Tylenol) or ibuprofen (Advil) as needed. °2. Take your usually prescribed medications unless otherwise directed °3. If you need a refill on your pain medication, please contact your pharmacy.  They will contact our office to request authorization.  Prescriptions will not be filled after 5pm or on week-ends. °4. You should eat very light the first 24 hours after surgery, such as soup, crackers, pudding, etc.  Resume your normal diet the day after surgery. °5. Most patients will experience some swelling and bruising in the breast.  Ice packs and a good support bra will help.  Swelling and bruising can take several days to resolve.  °6. It is common to experience some constipation if taking pain medication after surgery.  Increasing fluid intake and taking a stool softener will usually help or prevent this problem from occurring.  A mild laxative (Milk of Magnesia or Miralax) should be taken according to package directions if there are no bowel movements after 48 hours. °7. Unless discharge instructions indicate otherwise, you may remove your bandages 48 hours after surgery, and you may shower at that time.  You may have steri-strips (small skin tapes) in place directly over the incision.  These strips should be left on the skin for 7-10 days.   Any sutures or staples will be removed at the office during your follow-up visit. °8. ACTIVITIES:  You may resume  regular daily activities (gradually increasing) beginning the next day.  Wearing a good support bra or sports bra (or the breast binder) minimizes pain and swelling.  You may have sexual intercourse when it is comfortable. °a. You may drive when you no longer are taking prescription pain medication, you can comfortably wear a seatbelt, and you can safely maneuver your car and apply brakes. °b. RETURN TO WORK:  __________1 week_______________ °9. You should see your doctor in the office for a follow-up appointment approximately two weeks after your surgery.  Your doctor’s nurse will typically make your follow-up appointment when she calls you with your pathology report.  Expect your pathology report 2-3 business days after your surgery.  You may call to check if you do not hear from us after three days. ° ° °WHEN TO CALL YOUR DOCTOR: °1. Fever over 101.0 °2. Nausea and/or vomiting. °3. Extreme swelling or bruising. °4. Continued bleeding from incision. °5. Increased pain, redness, or drainage from the incision. ° °The clinic staff is available to answer your questions during regular business hours.  Please don’t hesitate to call and ask to speak to one of the nurses for clinical concerns.  If you have a medical emergency, go to the nearest emergency room or call 911.  A surgeon from [facility] is always on call at the hospital. ° °For further questions, please visit [email or web address]  ° °

## 2020-03-17 NOTE — Anesthesia Procedure Notes (Signed)
Procedure Name: LMA Insertion Date/Time: 03/17/2020 12:56 PM Performed by: Thelma Comp, CRNA Pre-anesthesia Checklist: Patient identified, Emergency Drugs available, Suction available and Patient being monitored Patient Re-evaluated:Patient Re-evaluated prior to induction Oxygen Delivery Method: Circle System Utilized Preoxygenation: Pre-oxygenation with 100% oxygen Induction Type: IV induction Ventilation: Mask ventilation without difficulty LMA: LMA inserted LMA Size: 4.0 Number of attempts: 1 Placement Confirmation: positive ETCO2 Tube secured with: Tape Dental Injury: Teeth and Oropharynx as per pre-operative assessment

## 2020-03-17 NOTE — Anesthesia Postprocedure Evaluation (Signed)
Anesthesia Post Note  Patient: ZAHIRAH CHESLOCK  Procedure(s) Performed: RIGHT BREAST LUMPECTOMY WITH RADIOACTIVE SEED AND SENTINEL LYMPH NODE BIOPSY (Right Breast)     Patient location during evaluation: PACU Anesthesia Type: General Level of consciousness: awake and alert Pain management: pain level controlled Vital Signs Assessment: post-procedure vital signs reviewed and stable Respiratory status: spontaneous breathing, nonlabored ventilation, respiratory function stable and patient connected to nasal cannula oxygen Cardiovascular status: blood pressure returned to baseline and stable Postop Assessment: no apparent nausea or vomiting Anesthetic complications: no   No complications documented.  Last Vitals:  Vitals:   03/17/20 1530 03/17/20 1545  BP: (!) 146/55 (!) 150/65  Pulse: 88 84  Resp: 20 15  Temp: (!) 36.3 C   SpO2: 96% 98%    Last Pain:  Vitals:   03/17/20 1515  TempSrc:   PainSc: 0-No pain                 Bani Gianfrancesco

## 2020-03-17 NOTE — Interval H&P Note (Signed)
History and Physical Interval Note:  03/17/2020 12:30 PM  Julia Fernandez  has presented today for surgery, with the diagnosis of RIGHT BREAST CANCER.  The various methods of treatment have been discussed with the patient and family. After consideration of risks, benefits and other options for treatment, the patient has consented to  Procedure(s) with comments: RIGHT BREAST LUMPECTOMY WITH RADIOACTIVE SEED AND SENTINEL LYMPH NODE BIOPSY (Right) - PEC BLOCK, RNFA as a surgical intervention.  The patient's history has been reviewed, patient examined, no change in status, stable for surgery.  I have reviewed the patient's chart and labs.  Questions were answered to the patient's satisfaction.     Stark Klein

## 2020-03-17 NOTE — Transfer of Care (Signed)
Immediate Anesthesia Transfer of Care Note  Patient: Julia Fernandez  Procedure(s) Performed: RIGHT BREAST LUMPECTOMY WITH RADIOACTIVE SEED AND SENTINEL LYMPH NODE BIOPSY (Right Breast)  Patient Location: PACU  Anesthesia Type:General and Regional  Level of Consciousness: awake and alert   Airway & Oxygen Therapy: Patient Spontanous Breathing  Post-op Assessment: Report given to RN, Post -op Vital signs reviewed and stable and Patient moving all extremities X 4  Post vital signs: Reviewed and stable  Last Vitals:  Vitals Value Taken Time  BP    Temp    Pulse    Resp    SpO2      Last Pain:  Vitals:   03/17/20 1140  TempSrc:   PainSc: 0-No pain         Complications: No complications documented.

## 2020-03-17 NOTE — Anesthesia Procedure Notes (Signed)
Anesthesia Regional Block: Pectoralis block   Pre-Anesthetic Checklist: ,, timeout performed, Correct Patient, Correct Site, Correct Laterality, Correct Procedure, Correct Position, site marked, Risks and benefits discussed,  Surgical consent,  Pre-op evaluation,  At surgeon's request and post-op pain management  Laterality: Right  Prep: chloraprep       Needles:  Injection technique: Single-shot  Needle Type: Echogenic Stimulator Needle     Needle Length: 5cm  Needle Gauge: 22     Additional Needles:   Procedures:, nerve stimulator,,, ultrasound used (permanent image in chart),,,,  Narrative:  Start time: 03/17/2020 11:35 AM End time: 03/17/2020 11:45 AM Injection made incrementally with aspirations every 5 mL.  Performed by: Personally  Anesthesiologist: Janeece Riggers, MD  Additional Notes: Functioning IV was confirmed and monitors were applied.  A 80mm 22ga Arrow echogenic stimulator needle was used. Sterile prep and drape,hand hygiene and sterile gloves were used. Ultrasound guidance: relevant anatomy identified, needle position confirmed, local anesthetic spread visualized around nerve(s)., vascular puncture avoided.  Image printed for medical record. Negative aspiration and negative test dose prior to incremental administration of local anesthetic. The patient tolerated the procedure well.

## 2020-03-18 ENCOUNTER — Encounter (HOSPITAL_COMMUNITY): Payer: Self-pay | Admitting: General Surgery

## 2020-03-29 LAB — SURGICAL PATHOLOGY

## 2020-03-29 NOTE — Progress Notes (Addendum)
[facility] CONSULT NOTE  Patient Care Team: Michell Heinrich, DO as PCP - General (Family Medicine)  CHIEF COMPLAINTS/PURPOSE OF CONSULTATION:  Newly diagnosed breast cancer  HISTORY OF PRESENTING ILLNESS:  Julia Fernandez 79 y.o. female is here because of recent diagnosis of invasive ductal carcinoma of the right breast. She palpated a right breast mass in 12/2019. Mammogram showed a 1.1cm mass at the 12 o'clock position in the right breast. Biopsy showed invasive ductal carcinoma, grade 2, ER+, PR-, HER-2 negative, Ki67 5%. She has a family history of breast cancer in two sisters, a sister with pancreatic cancer, and a niece with colon cancer.  She underwent right lumpectomy 03/18/2020: She is here today to discuss the adjuvant treatment plan..   I reviewed her records extensively and collaborated the history with the patient.  SUMMARY OF ONCOLOGIC HISTORY: Oncology History  Malignant neoplasm of upper-outer quadrant of right breast in female, estrogen receptor positive ([place])  12/2019 Initial Diagnosis   Palpated a right breast mass in 12/2019. Mammogram showed a 1.1cm mass at the 12 o'clock position in the right breast. Biopsy showed invasive ductal carcinoma, grade 2, ER+, PR-, HER-2 negative, Ki67 5%   03/17/2020 Surgery   Right lumpectomy: Grade 2 IDC, 1.1 cm, margins negative, 0/4 lymph nodes negative, ER 95%, PR 15%, HER-2 equivocal by IHC, FISH negative     MEDICAL HISTORY:  Past Medical History:  Diagnosis Date  . Asymptomatic stenosis of right carotid artery   . Benign hypertensive heart disease   . Family history of breast cancer   . Family history of colon cancer   . Family history of pancreatic cancer   . GERD (gastroesophageal reflux disease)   . Heart murmur    05/19/18 echo ([facility] LVEF 55-60%, moderate concentric LVH, nomral diastolic function, mild LAE, mild RVH, mild AS, mild-moderate AI/MR, estimated RVSP 20.6 mmHg  .  History of hiatal hernia   . History of hyperlipidemia   . Hypertension     SURGICAL HISTORY: Past Surgical History:  Procedure Laterality Date  . BREAST LUMPECTOMY WITH RADIOACTIVE SEED AND SENTINEL LYMPH NODE BIOPSY Right 03/17/2020   Procedure: RIGHT BREAST LUMPECTOMY WITH RADIOACTIVE SEED AND SENTINEL LYMPH NODE BIOPSY;  Surgeon: Stark Klein, MD;  Location: [facility];  Service: General;  Laterality: Right;  PEC BLOCK, RNFA  . CAROTID ENDARTERECTOMY Right 11/23/2015  . COLONOSCOPY  2019    SOCIAL HISTORY: Social History   Socioeconomic History  . Marital status: Widowed    Spouse name: Not on file  . Number of children: Not on file  . Years of education: Not on file  . Highest education level: Not on file  Occupational History  . Not on file  Tobacco Use  . Smoking status: Former Smoker    Years: 25.00    Types: Cigarettes    Quit date: 03/11/1979    Years since quitting: 41.0  . Smokeless tobacco: Never Used  . Tobacco comment: 4 cigarettes per day x25 years, stopped "40 years ago"  Vaping Use  . Vaping Use: Never used  Substance and Sexual Activity  . Alcohol use: Not on file    Comment: beer occasional  . Drug use: Never  . Sexual activity: Not on file  Other Topics Concern  . Not on file  Social History Narrative  . Not on file   Social Determinants of Health   Financial Resource Strain:   . Difficulty of Paying Living Expenses: Not on  file  Food Insecurity:   . Worried About Charity fundraiser in the Last Year: Not on file  . Ran Out of Food in the Last Year: Not on file  Transportation Needs:   . Lack of Transportation (Medical): Not on file  . Lack of Transportation (Non-Medical): Not on file  Physical Activity:   . Days of Exercise per Week: Not on file  . Minutes of Exercise per Session: Not on file  Stress:   . Feeling of Stress : Not on file  Social Connections:   . Frequency of Communication with Friends and Family: Not on file  . Frequency  of Social Gatherings with Friends and Family: Not on file  . Attends Religious Services: Not on file  . Active Member of Clubs or Organizations: Not on file  . Attends Archivist Meetings: Not on file  . Marital Status: Not on file  Intimate Partner Violence:   . Fear of Current or Ex-Partner: Not on file  . Emotionally Abused: Not on file  . Physically Abused: Not on file  . Sexually Abused: Not on file    FAMILY HISTORY: Family History  Problem Relation Age of Onset  . Dementia Mother   . Heart attack Father   . Pancreatic cancer Sister   . Hodgkin's lymphoma Maternal Uncle   . Breast cancer Sister        dx 73s  . Lung cancer Sister   . Breast cancer Sister        d. 3  . Colon cancer Half-Sister   . Colon cancer Niece        d. 85    ALLERGIES:  is allergic to codeine, fish allergy, penicillins, and prednisone.  MEDICATIONS:  Current Outpatient Medications  Medication Sig Dispense Refill  . amLODipine (NORVASC) 5 MG tablet Take 5 mg by mouth in the morning and at bedtime.     Marland Kitchen anastrozole (ARIMIDEX) 1 MG tablet Take 1 tablet (1 mg total) by mouth daily. 90 tablet 3  . aspirin EC 81 MG tablet Take 81 mg by mouth daily. Swallow whole.    . Cholecalciferol (VITAMIN D3 SUPER STRENGTH) 50 MCG (2000 UT) TABS Take 2,000 Units by mouth daily.    . hydrochlorothiazide (HYDRODIURIL) 12.5 MG tablet Take 12.5 mg by mouth daily.    Marland Kitchen losartan (COZAAR) 100 MG tablet Take 100 mg by mouth daily.    . metoprolol tartrate (LOPRESSOR) 25 MG tablet Take 25 mg by mouth daily.    Marland Kitchen oxyCODONE (OXY IR/ROXICODONE) 5 MG immediate release tablet Take 0.5-1 tablets (2.5-5 mg total) by mouth every 6 (six) hours as needed for severe pain. 10 tablet 0  . rosuvastatin (CRESTOR) 20 MG tablet Take 20 mg by mouth daily.      No current facility-administered medications for this visit.    REVIEW OF SYSTEMS:     All other systems were reviewed with the patient and are  negative.  PHYSICAL EXAMINATION: ECOG PERFORMANCE STATUS: 1 - Symptomatic but completely ambulatory  Vitals:   03/30/20 1301  BP: (!) 182/60  Pulse: 69  Resp: 13  Temp: (!) 97.3 F (36.3 C)  SpO2: 99%   Filed Weights   03/30/20 1301  Weight: 151 lb 12.8 oz (68.9 kg)    LABORATORY DATA:  I have reviewed the data as listed Lab Results  Component Value Date   WBC 8.9 03/10/2020   HGB 12.9 03/10/2020   HCT 38.1 03/10/2020   MCV  87.4 03/10/2020   PLT 281 03/10/2020   Lab Results  Component Value Date   NA 133 (L) 03/10/2020   K 3.2 (L) 03/10/2020   CL 95 (L) 03/10/2020   CO2 25 03/10/2020    RADIOGRAPHIC STUDIES: I have personally reviewed the radiological reports and agreed with the findings in the report.  ASSESSMENT AND PLAN:  Malignant neoplasm of upper-outer quadrant of right breast in female, estrogen receptor positive ([place]) Palpable mass in the right breast 12/2019. Mammogram showed a 1.1cm mass at the 12 o'clock position in the right breast. Biopsy showed invasive ductal carcinoma, grade 2, ER+, PR-, HER-2 negative, Ki67 5%  03/17/2020: Right lumpectomy: Grade 2 IDC, 1.1 cm, margins negative, 0/4 lymph nodes negative, ER 95%, PR 15%, HER-2 equivocal by IHC, FISH negative  Treatment plan: 1.  adjuvant radiation therapy 2.  Followed adjuvant antiestrogen therapy with anastrozole 1 mg daily x5 years  Anastrozole counseling:We discussed the risks and benefits of anti-estrogen therapy with aromatase inhibitors. These include but not limited to insomnia, hot flashes, mood changes, vaginal dryness, bone density loss, and weight gain. We strongly believe that the benefits far outweigh the risks. Patient understands these risks and consented to starting treatment. Planned treatment duration is 5 years.   Return to clinic after radiation for follow-up   All questions were answered. The patient knows to call the clinic with any problems, questions or concerns.   Rulon Eisenmenger, MD, MPH 04/01/2020    I, Molly Dorshimer, am acting as scribe for Nicholas Lose, MD.  I have reviewed the above documentation for accuracy and completeness, and I agree with the above.

## 2020-03-30 ENCOUNTER — Encounter: Payer: Self-pay | Admitting: Genetic Counselor

## 2020-03-30 ENCOUNTER — Inpatient Hospital Stay: Payer: Medicare Other

## 2020-03-30 ENCOUNTER — Other Ambulatory Visit: Payer: Self-pay

## 2020-03-30 ENCOUNTER — Inpatient Hospital Stay: Payer: Medicare Other | Admitting: Genetic Counselor

## 2020-03-30 ENCOUNTER — Inpatient Hospital Stay (HOSPITAL_BASED_OUTPATIENT_CLINIC_OR_DEPARTMENT_OTHER): Payer: Medicare Other | Admitting: Genetic Counselor

## 2020-03-30 ENCOUNTER — Inpatient Hospital Stay: Payer: Medicare Other | Attending: Hematology and Oncology | Admitting: Hematology and Oncology

## 2020-03-30 DIAGNOSIS — E785 Hyperlipidemia, unspecified: Secondary | ICD-10-CM | POA: Insufficient documentation

## 2020-03-30 DIAGNOSIS — Z170 Estrogen receptor positive status [ER+]: Secondary | ICD-10-CM | POA: Insufficient documentation

## 2020-03-30 DIAGNOSIS — C50411 Malignant neoplasm of upper-outer quadrant of right female breast: Secondary | ICD-10-CM

## 2020-03-30 DIAGNOSIS — Z79899 Other long term (current) drug therapy: Secondary | ICD-10-CM | POA: Insufficient documentation

## 2020-03-30 DIAGNOSIS — Z803 Family history of malignant neoplasm of breast: Secondary | ICD-10-CM

## 2020-03-30 DIAGNOSIS — Z800 Family history of malignant neoplasm of digestive organs: Secondary | ICD-10-CM | POA: Insufficient documentation

## 2020-03-30 DIAGNOSIS — Z7982 Long term (current) use of aspirin: Secondary | ICD-10-CM | POA: Insufficient documentation

## 2020-03-30 DIAGNOSIS — Z87891 Personal history of nicotine dependence: Secondary | ICD-10-CM | POA: Diagnosis not present

## 2020-03-30 DIAGNOSIS — Z79811 Long term (current) use of aromatase inhibitors: Secondary | ICD-10-CM | POA: Insufficient documentation

## 2020-03-30 DIAGNOSIS — I10 Essential (primary) hypertension: Secondary | ICD-10-CM | POA: Diagnosis not present

## 2020-03-30 DIAGNOSIS — K449 Diaphragmatic hernia without obstruction or gangrene: Secondary | ICD-10-CM | POA: Insufficient documentation

## 2020-03-30 MED ORDER — ANASTROZOLE 1 MG PO TABS: 1.0000 mg | ORAL_TABLET | Freq: Every day | ORAL | 3 refills | Status: DC

## 2020-03-30 NOTE — Assessment & Plan Note (Addendum)
Palpable mass in the right breast 12/2019. Mammogram showed a 1.1cm mass at the 12 o'clock position in the right breast. Biopsy showed invasive ductal carcinoma, grade 2, ER+, PR-, HER-2 negative, Ki67 5%  03/17/2020: Right lumpectomy: Grade 2 IDC, 1.1 cm, margins negative, 0/4 lymph nodes negative, ER 95%, PR 15%, HER-2 equivocal by IHC, FISH negative  Treatment plan: 1.  adjuvant radiation therapy 2.  Followed adjuvant antiestrogen therapy with anastrozole 1 mg daily x5 years  Anastrozole counseling:We discussed the risks and benefits of anti-estrogen therapy with aromatase inhibitors. These include but not limited to insomnia, hot flashes, mood changes, vaginal dryness, bone density loss, and weight gain. We strongly believe that the benefits far outweigh the risks. Patient understands these risks and consented to starting treatment. Planned treatment duration is 5 years.   Return to clinic after radiation for follow-up

## 2020-03-30 NOTE — Progress Notes (Signed)
REFERRING PROVIDER: Michell Heinrich, [place] [address]  PRIMARY PROVIDER:  Michell Heinrich, DO  PRIMARY REASON FOR VISIT:  1. Malignant neoplasm of upper-outer quadrant of right breast in female, estrogen receptor positive ([place])   2. Family history of breast cancer   3. Family history of pancreatic cancer   4. Family history of colon cancer      HISTORY OF PRESENT ILLNESS:   Julia Fernandez, a 79 y.o. female, was seen for a [facility] cancer genetics consultation at the request of Dr. Marveen Reeks due to a personal and family history of cancer.  Julia Fernandez presents to clinic today to discuss the possibility of a hereditary predisposition to cancer, genetic testing, and to further clarify her future cancer risks, as well as potential cancer risks for family members.   In September 2021, at the age of 72, Julia Fernandez was diagnosed with cancer of the right breast. The treatment plan included lumpectomy and radiation.     CANCER HISTORY:  Oncology History  Malignant neoplasm of upper-outer quadrant of right breast in female, estrogen receptor positive ([place])  12/2019 Initial Diagnosis   Palpated a right breast mass in 12/2019. Mammogram showed a 1.1cm mass at the 12 o'clock position in the right breast. Biopsy showed invasive ductal carcinoma, grade 2, ER+, PR-, HER-2 negative, Ki67 5%      RISK FACTORS:  Menarche was at age 57.  First live birth at age 61.  OCP use for approximately <5 years years.  Ovaries intact: yes.  Hysterectomy: no.  Menopausal status: postmenopausal.  HRT use: 1-2 years. Colonoscopy: yes; 5-10 polyps. Mammogram within the last year: yes. Number of breast biopsies: 1. Up to date with pelvic exams: n/a. Any excessive radiation exposure in the past: no  Past Medical History:  Diagnosis Date  . Asymptomatic stenosis of right carotid artery   . Benign hypertensive heart disease   . Family history of breast cancer   . Family history of colon cancer    . Family history of pancreatic cancer   . GERD (gastroesophageal reflux disease)   . Heart murmur    05/19/18 echo ([facility]): LVEF 55-60%, moderate concentric LVH, nomral diastolic function, mild LAE, mild RVH, mild AS, mild-moderate AI/MR, estimated RVSP 20.6 mmHg  . History of hiatal hernia   . History of hyperlipidemia   . Hypertension     Past Surgical History:  Procedure Laterality Date  . BREAST LUMPECTOMY WITH RADIOACTIVE SEED AND SENTINEL LYMPH NODE BIOPSY Right 03/17/2020   Procedure: RIGHT BREAST LUMPECTOMY WITH RADIOACTIVE SEED AND SENTINEL LYMPH NODE BIOPSY;  Surgeon: Stark Klein, MD;  Location: [facility];  Service: General;  Laterality: Right;  PEC BLOCK, RNFA  . CAROTID ENDARTERECTOMY Right 11/23/2015  . COLONOSCOPY  2019    Social History   Socioeconomic History  . Marital status: Widowed    Spouse name: Not on file  . Number of children: Not on file  . Years of education: Not on file  . Highest education level: Not on file  Occupational History  . Not on file  Tobacco Use  . Smoking status: Former Smoker    Years: 25.00    Types: Cigarettes    Quit date: 03/11/1979    Years since quitting: 41.0  . Smokeless tobacco: Never Used  . Tobacco comment: 4 cigarettes per day x25 years, stopped "40 years ago"  Vaping Use  . Vaping Use: Never used  Substance and Sexual Activity  .  Alcohol use: Not on file    Comment: beer occasional  . Drug use: Never  . Sexual activity: Not on file  Other Topics Concern  . Not on file  Social History Narrative  . Not on file   Social Determinants of Health   Financial Resource Strain:   . Difficulty of Paying Living Expenses: Not on file  Food Insecurity:   . Worried About Charity fundraiser in the Last Year: Not on file  . Ran Out of Food in the Last Year: Not on file  Transportation Needs:   . Lack of Transportation (Medical): Not on file  . Lack of Transportation (Non-Medical): Not on file   Physical Activity:   . Days of Exercise per Week: Not on file  . Minutes of Exercise per Session: Not on file  Stress:   . Feeling of Stress : Not on file  Social Connections:   . Frequency of Communication with Friends and Family: Not on file  . Frequency of Social Gatherings with Friends and Family: Not on file  . Attends Religious Services: Not on file  . Active Member of Clubs or Organizations: Not on file  . Attends Archivist Meetings: Not on file  . Marital Status: Not on file     FAMILY HISTORY:  We obtained a detailed, 4-generation family history.  Significant diagnoses are listed below: Family History  Problem Relation Age of Onset  . Dementia Mother   . Heart attack Father   . Pancreatic cancer Sister   . Hodgkin's lymphoma Maternal Uncle   . Breast cancer Sister        dx 79s  . Lung cancer Sister   . Breast cancer Sister        d. 61  . Colon cancer Half-Sister   . Colon cancer Niece        d. 77    The patient has a son and daughter who are cancer free.  She has four full sisters, and two paternal half brothers and a half sister.  One sister had pancreatic cancer, two sisters had breast cancer, one sister who did not have cancer had a daughter who died of colon cancer at 34.  Her paternal half sister had colon cancer.  Both parents are deceased.  The patients mother died of dementia.  She had multiple siblings, one who had Hodgkin's Lymphoma.  There is no other known cancer on this side.  The patient's father died of heart disease.  There is very little information known about his family.  Julia Fernandez is unaware of previous family history of genetic testing for hereditary cancer risks. Patient's maternal ancestors are of Caucasian descent, and paternal ancestors are of [place] and [place] descent. There is no reported Ashkenazi Jewish ancestry. There is no known consanguinity.  GENETIC COUNSELING ASSESSMENT: Julia Fernandez is a 79 y.o. female with a  personal and family history of breast cancer which is somewhat suggestive of a hereditary cancer syndrome and predisposition to cancer given the number of people with cancer, the combination of cancer and young ages of onset. We, therefore, discussed and recommended the following at today's visit.   DISCUSSION: We discussed that 5 - 10% of breast cancer is hereditary, with most cases associated with BRCA mutations.  There are other genes that can be associated with hereditary breast cancer syndromes.  These include ATM, CHEK2 and PALB2.  We discussed that testing is beneficial for several reasons including knowing how  to follow individuals after completing their treatment,and understand if other family members could be at risk for cancer and allow them to undergo genetic testing.   We reviewed the characteristics, features and inheritance patterns of hereditary cancer syndromes. We also discussed genetic testing, including the appropriate family members to test, the process of testing, insurance coverage and turn-around-time for results. We discussed the implications of a negative, positive, carrier and/or variant of uncertain significant result. We recommended Julia Fernandez pursue genetic testing for the common hereditary cancer gene panel. The Common Hereditary Gene Panel offered by [facility] includes sequencing and/or deletion duplication testing of the following 48 genes: APC, ATM, AXIN2, BARD1, BMPR1A, BRCA1, BRCA2, BRIP1, CDH1, CDK4, CDKN2A (p14ARF), CDKN2A (p16INK4a), CHEK2, CTNNA1, DICER1, EPCAM (Deletion/duplication testing only), GREM1 (promoter region deletion/duplication testing only), KIT, MEN1, MLH1, MSH2, MSH3, MSH6, MUTYH, NBN, NF1, NHTL1, PALB2, PDGFRA, PMS2, POLD1, POLE, PTEN, RAD50, RAD51C, RAD51D, RNF43, SDHB, SDHC, SDHD, SMAD4, SMARCA4. STK11, TP53, TSC1, TSC2, and VHL.  The following genes were evaluated for sequence changes only: SDHA and HOXB13 c.251G>A variant only.   Based on Ms. Leather's  personal and family history of cancer, she meets medical criteria for genetic testing. Despite that she meets criteria, she may still have an out of pocket cost. We discussed that if her out of pocket cost for testing is over $100, the laboratory will call and confirm whether she wants to proceed with testing.  If the out of pocket cost of testing is less than $100 she will be billed by the genetic testing laboratory.   PLAN: After considering the risks, benefits, and limitations, Julia Fernandez provided informed consent to pursue genetic testing and the blood sample was sent to [facility] for analysis of the common hereditary cancer panel. Results should be available within approximately 2-3 weeks' time, at which point they will be disclosed by telephone to Julia Fernandez, as will any additional recommendations warranted by these results. Julia Fernandez will receive a summary of her genetic counseling visit and a copy of her results once available. This information will also be available in Epic.   Lastly, we encouraged Julia Fernandez to remain in contact with cancer genetics annually so that we can continuously update the family history and inform her of any changes in cancer genetics and testing that may be of benefit for this family.   Julia Fernandez's questions were answered to her satisfaction today. Our contact information was provided should additional questions or concerns arise. Thank you for the referral and allowing [place] to share in the care of your patient.   Gweneth Fredlund P. Florene [place], [place], [facility] Licensed, Insurance risk surveyor [email or web address] phone: [phone]  The patient was seen for a total of 30 minutes in face-to-face genetic counseling.  This patient was discussed with Drs. Magrinat, Lindi Adie and/or Burr Medico who agrees with the above.    _______________________________________________________________________ For Office Staff:  Number of people involved in session: 2 Was an Intern/  student involved with case: yes Julio Sicks

## 2020-04-04 ENCOUNTER — Encounter: Payer: Self-pay | Admitting: *Deleted

## 2020-04-04 ENCOUNTER — Other Ambulatory Visit: Payer: Self-pay | Admitting: *Deleted

## 2020-04-04 ENCOUNTER — Telehealth: Payer: Self-pay | Admitting: *Deleted

## 2020-04-04 DIAGNOSIS — C50411 Malignant neoplasm of upper-outer quadrant of right female breast: Secondary | ICD-10-CM

## 2020-04-04 DIAGNOSIS — Z170 Estrogen receptor positive status [ER+]: Secondary | ICD-10-CM

## 2020-04-04 NOTE — Telephone Encounter (Signed)
LVM for call back to schedule appointment with Dr. Moody. 

## 2020-04-05 ENCOUNTER — Telehealth: Payer: Self-pay | Admitting: *Deleted

## 2020-04-05 ENCOUNTER — Telehealth: Payer: Self-pay | Admitting: Licensed Clinical Social Worker

## 2020-04-05 NOTE — Telephone Encounter (Signed)
Spoke to patient and she is requesting treatment to be done in [place] which is closer to home.

## 2020-04-05 NOTE — Telephone Encounter (Signed)
LVM for call back to schedule an appointment with Dr. Lisbeth Renshaw.

## 2020-04-05 NOTE — Telephone Encounter (Signed)
[facility]   CSW attempted to contact new patient to inform of support services and assess for needs. No answer. Left VM with direct contact information.   Christeen Douglas, LCSW

## 2020-04-11 ENCOUNTER — Encounter: Payer: Self-pay | Admitting: *Deleted

## 2020-04-12 NOTE — Progress Notes (Signed)
Location of Breast Cancer: Right Breast UOQ  Did patient present with symptoms (if so, please note symptoms) or was this found on screening mammography?: She palpated a mass in her right breast in September 2021.  Mammogram: 1.1 cm mass at the 12 o'clock position in the right breast.  Histology per Pathology Report: Right Breast Lumpectomy 03/17/2020  Receptor Status: ER(+), PR (-), Her2-neu (-), Ki-67(5%)    Past/Anticipated interventions by surgeon, if any: Dr. Barry Dienes 04/04/2020 - Seroma on upper right breast.  No evidence of infection.  Aspirated 60 mL of serosanguineous fluid. -Patient to start anastrozole post XRT and have virtual visit with Dr. Lindi Adie 06/2020. -I will see her back in June 2022. -Mammogram due 12/2020.  -Right breast lumpectomy with radioactive seed and sentinel lymph node biopsy 03/17/2020  Past/Anticipated interventions by medical oncology, if any: Chemotherapy  Dr. Lindi Adie 03/30/2020 -Treatment plan: 1.  adjuvant radiation therapy 2.  Followed adjuvant antiestrogen therapy with anastrozole 1 mg daily x5 years   Lymphedema issues, if any:  No  Pain issues, if any:  None  SAFETY ISSUES:  Prior radiation? No  Pacemaker/ICD? No  Possible current pregnancy? Postmenopausal  Is the patient on methotrexate? No  Current Complaints / other details:      Cori Razor, RN 04/12/2020,2:01 PM

## 2020-04-13 ENCOUNTER — Ambulatory Visit
Admission: RE | Admit: 2020-04-13 | Discharge: 2020-04-13 | Disposition: A | Discharge status: Home / Self Care | Payer: Medicare Other | Source: Ambulatory Visit | Attending: Radiation Oncology | Admitting: Radiation Oncology

## 2020-04-13 ENCOUNTER — Other Ambulatory Visit: Payer: Self-pay

## 2020-04-13 ENCOUNTER — Encounter: Payer: Self-pay | Admitting: Radiation Oncology

## 2020-04-13 VITALS — BP 177/72 | HR 66 | Temp 96.9°F | Resp 18 | Ht 63.0 in | Wt 150.4 lb

## 2020-04-13 DIAGNOSIS — I6521 Occlusion and stenosis of right carotid artery: Secondary | ICD-10-CM | POA: Insufficient documentation

## 2020-04-13 DIAGNOSIS — Z801 Family history of malignant neoplasm of trachea, bronchus and lung: Secondary | ICD-10-CM | POA: Diagnosis not present

## 2020-04-13 DIAGNOSIS — E785 Hyperlipidemia, unspecified: Secondary | ICD-10-CM | POA: Insufficient documentation

## 2020-04-13 DIAGNOSIS — Z170 Estrogen receptor positive status [ER+]: Secondary | ICD-10-CM | POA: Insufficient documentation

## 2020-04-13 DIAGNOSIS — Z803 Family history of malignant neoplasm of breast: Secondary | ICD-10-CM | POA: Insufficient documentation

## 2020-04-13 DIAGNOSIS — K219 Gastro-esophageal reflux disease without esophagitis: Secondary | ICD-10-CM | POA: Insufficient documentation

## 2020-04-13 DIAGNOSIS — Z7982 Long term (current) use of aspirin: Secondary | ICD-10-CM | POA: Insufficient documentation

## 2020-04-13 DIAGNOSIS — Z79899 Other long term (current) drug therapy: Secondary | ICD-10-CM | POA: Diagnosis not present

## 2020-04-13 DIAGNOSIS — Z87891 Personal history of nicotine dependence: Secondary | ICD-10-CM | POA: Insufficient documentation

## 2020-04-13 DIAGNOSIS — Z800 Family history of malignant neoplasm of digestive organs: Secondary | ICD-10-CM | POA: Diagnosis not present

## 2020-04-13 DIAGNOSIS — C50411 Malignant neoplasm of upper-outer quadrant of right female breast: Secondary | ICD-10-CM | POA: Diagnosis not present

## 2020-04-13 DIAGNOSIS — I10 Essential (primary) hypertension: Secondary | ICD-10-CM | POA: Insufficient documentation

## 2020-04-13 NOTE — Progress Notes (Signed)
Radiation Oncology         [phone] ________________________________  Name: Julia Fernandez        MRN: [id number]  Date of Service: 04/13/2020 DOB: 12-07-1940  CC:Michell Heinrich, DO  Nicholas Lose, MD     REFERRING PHYSICIAN: Nicholas Lose, MD   DIAGNOSIS: The encounter diagnosis was Malignant neoplasm of upper-outer quadrant of right breast in female, estrogen receptor positive ([place]).   HISTORY OF PRESENT ILLNESS: Julia Fernandez is a 79 y.o. female with a new diagnosis of right breast cancer. The patient was noted to have a palpable mass in her right breast. Diagnostic imaging revealed a an area at 12:00 measuring 9 x 11 mm with smooth borders. No discussion of axillary status is noted. 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 She underwent a lumpectomy on 03/17/20 revealed a grade 2 invasive ductal carcinoma that measured 1.1 cm and her margins were negative, but her closest margin was focally 1 mm to the anterior margin. 4 sampled nodes were negative. She met with Dr. Lindi Adie a few weeks ago and she is seen today to discuss options of adjuvant therapy.    PREVIOUS RADIATION THERAPY: No   PAST MEDICAL HISTORY:  Past Medical History:  Diagnosis Date  . Asymptomatic stenosis of right carotid artery   . Benign hypertensive heart disease   . Family history of breast cancer   . Family history of colon cancer   . Family history of pancreatic cancer   . GERD (gastroesophageal reflux disease)   . Heart murmur    05/19/18 echo ([facility]): LVEF 55-60%, moderate concentric LVH, nomral diastolic function, mild LAE, mild RVH, mild AS, mild-moderate AI/MR, estimated RVSP 20.6 mmHg  . History of hiatal hernia   . History of hyperlipidemia   . Hypertension        PAST SURGICAL HISTORY: Past Surgical History:  Procedure Laterality Date  . BREAST LUMPECTOMY WITH  RADIOACTIVE SEED AND SENTINEL LYMPH NODE BIOPSY Right 03/17/2020   Procedure: RIGHT BREAST LUMPECTOMY WITH RADIOACTIVE SEED AND SENTINEL LYMPH NODE BIOPSY;  Surgeon: Stark Klein, MD;  Location: [facility];  Service: General;  Laterality: Right;  PEC BLOCK, RNFA  . CAROTID ENDARTERECTOMY Right 11/23/2015  . COLONOSCOPY  2019     FAMILY HISTORY:  Family History  Problem Relation Age of Onset  . Dementia Mother   . Heart attack Father   . Pancreatic cancer Sister   . Hodgkin's lymphoma Maternal Uncle   . Breast cancer Sister        dx 50s  . Lung cancer Sister   . Breast cancer Sister        d. 17  . Colon cancer Half-Sister   . Colon cancer Niece        d. 66     SOCIAL HISTORY:  reports that she quit smoking about 41 years ago. Her smoking use included cigarettes. She quit after 25.00 years of use. She has never used smokeless tobacco. She reports that she does not use drugs. The patient lives in [place], [place] and is accompanied by her daughter.   ALLERGIES: Codeine, Fish allergy, Penicillins, and Prednisone   MEDICATIONS:  Current Outpatient Medications  Medication Sig Dispense Refill  . amLODipine (NORVASC) 5 MG tablet Take 5 mg by mouth in the morning and at bedtime.     Marland Kitchen anastrozole (ARIMIDEX) 1  MG tablet Take 1 tablet (1 mg total) by mouth daily. 90 tablet 3  . aspirin EC 81 MG tablet Take 81 mg by mouth daily. Swallow whole.    . Cholecalciferol (VITAMIN D3 SUPER STRENGTH) 50 MCG (2000 UT) TABS Take 2,000 Units by mouth daily.    . hydrochlorothiazide (HYDRODIURIL) 12.5 MG tablet Take 12.5 mg by mouth daily.    Marland Kitchen losartan (COZAAR) 100 MG tablet Take 100 mg by mouth daily.    . metoprolol tartrate (LOPRESSOR) 25 MG tablet Take 25 mg by mouth daily.    Marland Kitchen oxyCODONE (OXY IR/ROXICODONE) 5 MG immediate release tablet Take 0.5-1 tablets (2.5-5 mg total) by mouth every 6 (six) hours as needed for severe pain. 10 tablet 0  . rosuvastatin (CRESTOR) 20 MG tablet Take 20 mg by mouth  daily.      No current facility-administered medications for this encounter.     REVIEW OF SYSTEMS: On review of systems, the patient reports that she is doing well overall. She denies any concerns with her surgical site, but she has had to have a seroma aspirated. She goes later today to have this aspirated again. She reports she would like all of her radiation treatments to be given closer to home.      PHYSICAL EXAM:  Wt Readings from Last 3 Encounters:  04/13/20 150 lb 6 oz (68.2 kg)  03/30/20 151 lb 12.8 oz (68.9 kg)  03/17/20 142 lb (64.4 kg)   Temp Readings from Last 3 Encounters:  04/13/20 (!) 96.9 F (36.1 C) (Temporal)  03/30/20 (!) 97.3 F (36.3 C) (Tympanic)  03/17/20 (!) 97.4 F (36.3 C)   BP Readings from Last 3 Encounters:  04/13/20 (!) 177/72  03/30/20 (!) 182/60  03/17/20 (!) 150/65   Pulse Readings from Last 3 Encounters:  04/13/20 66  03/30/20 69  03/17/20 84    In general this is a well appearing caucasian female in no acute distress. She's alert and oriented x4 and appropriate throughout the examination. Cardiopulmonary assessment is negative for acute distress and she exhibits normal effort. Bilateral breast exam is deferred.    ECOG = 0  0 - Asymptomatic (Fully active, able to carry on all predisease activities without restriction)  1 - Symptomatic but completely ambulatory (Restricted in physically strenuous activity but ambulatory and able to carry out work of a light or sedentary nature. For example, light housework, office work)  2 - Symptomatic, <50% in bed during the day (Ambulatory and capable of all self care but unable to carry out any work activities. Up and about more than 50% of waking hours)  3 - Symptomatic, >50% in bed, but not bedbound (Capable of only limited self-care, confined to bed or chair 50% or more of waking hours)  4 - Bedbound (Completely disabled. Cannot carry on any self-care. Totally confined to bed or chair)  5 -  Death   Eustace Pen MM, Creech RH, Tormey DC, et al. [phone]). "Toxicity and response criteria of the [facility]". [place] Oncol. 5 (6): 649-55    LABORATORY DATA:  Lab Results  Component Value Date   WBC 8.9 03/10/2020   HGB 12.9 03/10/2020   HCT 38.1 03/10/2020   MCV 87.4 03/10/2020   PLT 281 03/10/2020   Lab Results  Component Value Date   NA 133 (L) 03/10/2020   K 3.2 (L) 03/10/2020   CL 95 (L) 03/10/2020   CO2 25 03/10/2020   Lab Results  Component Value  Date   ALT 17 03/10/2020   AST 25 03/10/2020   ALKPHOS 37 (L) 03/10/2020   BILITOT 0.8 03/10/2020      RADIOGRAPHY: NM Sentinel Node Inj-No Rpt (Breast)  Result Date: 03/17/2020 Sulfur colloid was injected by the nuclear medicine technologist for melanoma sentinel node.   MM Breast Surgical Specimen  Result Date: 03/17/2020 CLINICAL DATA:  Biopsy-proven malignancy involving the upper RIGHT breast. Radioactive seed localization was performed yesterday in anticipation of today's lumpectomy. EXAM: SPECIMEN RADIOGRAPH OF THE RIGHT BREAST COMPARISON:  Previous exam(s). FINDINGS: Status post excision of the RIGHT breast. The radioactive seed, the tissue marker clip and the mass are present in the specimen. The seed is completely intact. The seed and the tissue marker clips are marked for pathology. This was discussed with the operating room nurse at the time of interpretation on 03/17/2020 at 1:50 p.m. IMPRESSION: Specimen radiograph of the RIGHT breast. Electronically Signed   By: Evangeline Dakin M.D.   On: 03/17/2020 13:53   [place] RT RADIOACTIVE SEED LOC  Result Date: 03/16/2020 CLINICAL DATA:  79 year old female for radioactive seed localization of RIGHT breast cancer prior to lumpectomy. EXAM: ULTRASOUND GUIDED RADIOACTIVE SEED LOCALIZATION OF THE RIGHT BREAST DIAGNOSTIC RIGHT MAMMOGRAM POST ULTRASOUND-GUIDED RADIOACTIVE SEED PLACEMENT COMPARISON:  Prior outside studies. FINDINGS: Patient presents for  radioactive seed localization prior to RIGHT lumpectomy. I met with the patient and we discussed the procedure of seed localization including benefits and alternatives. We discussed the high likelihood of a successful procedure. We discussed the risks of the procedure including infection, bleeding, tissue injury and further surgery. We discussed the low dose of radioactivity involved in the procedure. Informed, written consent was given. The usual time-out protocol was performed immediately prior to the procedure. Using ultrasound guidance, sterile technique, 1% lidocaine and an I-125 radioactive seed, the 1.3 cm mass containing biopsy clip at the 12 o'clock position of the RIGHT breast was localized using a LATERAL approach. Follow-up survey of the patient confirms presence of the radioactive seed. Order number of I-125 seed:  [id number]. Total activity:  4.098 millicuries.  Reference Date: 03/09/2020. Mammographic images were obtained following ultrasound-guided radioactive seed placement confirming the seed in the expected location within the mass and adjacent to the biopsy clip and were marked for Dr. Barry Dienes. The patient tolerated the procedure well and was released from the Breast Center. She was given instructions regarding seed removal. IMPRESSION: Radioactive seed localization RIGHT breast. No apparent complications. Electronically Signed   By: Margarette Canada M.D.   On: 03/16/2020 11:35   MM CLIP PLACEMENT RIGHT  Result Date: 03/16/2020 CLINICAL DATA:  79 year old female for radioactive seed localization of RIGHT breast cancer prior to lumpectomy. EXAM: ULTRASOUND GUIDED RADIOACTIVE SEED LOCALIZATION OF THE RIGHT BREAST DIAGNOSTIC RIGHT MAMMOGRAM POST ULTRASOUND-GUIDED RADIOACTIVE SEED PLACEMENT COMPARISON:  Prior outside studies. FINDINGS: Patient presents for radioactive seed localization prior to RIGHT lumpectomy. I met with the patient and we discussed the procedure of seed localization including  benefits and alternatives. We discussed the high likelihood of a successful procedure. We discussed the risks of the procedure including infection, bleeding, tissue injury and further surgery. We discussed the low dose of radioactivity involved in the procedure. Informed, written consent was given. The usual time-out protocol was performed immediately prior to the procedure. Using ultrasound guidance, sterile technique, 1% lidocaine and an I-125 radioactive seed, the 1.3 cm mass containing biopsy clip at the 12 o'clock position of the RIGHT breast was localized using a LATERAL approach. Follow-up  survey of the patient confirms presence of the radioactive seed. Order number of I-125 seed:  [id number]. Total activity:  0.698 millicuries.  Reference Date: 03/09/2020. Mammographic images were obtained following ultrasound-guided radioactive seed placement confirming the seed in the expected location within the mass and adjacent to the biopsy clip and were marked for Dr. Barry Dienes. The patient tolerated the procedure well and was released from the Breast Center. She was given instructions regarding seed removal. IMPRESSION: Radioactive seed localization RIGHT breast. No apparent complications. Electronically Signed   By: Margarette Canada M.D.   On: 03/16/2020 11:35       IMPRESSION/PLAN: 1. Stage IA, [id number] grade 2, ER/PR positive invasive ductal carcinoma of the right breast. Dr. Lisbeth Renshaw has reviewed her case. I spent time with her discussing the pathology findings and  the nature of right breast disease.  We discussed the utility and rationale for radiotherapy in patients who undergo breast conservation therapy. We discussed scenarios for which  external radiotherapy to the breast may be optional. While overall her diagnosis is favorable, her close margin, grade 2 and tumor size are reason to at least consider radiotherapy followed by antiestrogen therapy.I emphasized that sometimes this decision making also has to do with  how well someone is functionally living. Her biological age seems to be much younger than her stated age, so we have offered a 4 week course of adjuvant radiotherapy. We discussed the risks, benefits, short, and long term effects of radiotherapy, as well as the curative intent, and the patient is interested in proceeding, but closer to home. I will let Dr. Lisbeth Renshaw know and refer her up to [place] through [facility] cancer team. She is in agreement with the plan and is leaning toward radiation.   In a visit lasting 60 minutes, greater than 50% of the time was spent face to face reviewing her case, as well as in preparation of, discussing, and coordinating the patient's care.    Carola Rhine, PAC Seen on behalf of Jodelle Gross, MD, PhD

## 2020-04-18 ENCOUNTER — Encounter: Payer: Self-pay | Admitting: *Deleted

## 2020-04-25 ENCOUNTER — Telehealth: Payer: Self-pay | Admitting: Genetic Counselor

## 2020-04-25 ENCOUNTER — Encounter: Payer: Self-pay | Admitting: Genetic Counselor

## 2020-04-25 DIAGNOSIS — Z1379 Encounter for other screening for genetic and chromosomal anomalies: Secondary | ICD-10-CM | POA: Insufficient documentation

## 2020-04-25 NOTE — Telephone Encounter (Signed)
LM on VM that results are back and to please call. 

## 2020-04-27 ENCOUNTER — Ambulatory Visit: Payer: Self-pay | Admitting: Genetic Counselor

## 2020-04-27 ENCOUNTER — Other Ambulatory Visit: Payer: Self-pay | Admitting: Urology

## 2020-04-27 DIAGNOSIS — C50411 Malignant neoplasm of upper-outer quadrant of right female breast: Secondary | ICD-10-CM

## 2020-04-27 DIAGNOSIS — Z1379 Encounter for other screening for genetic and chromosomal anomalies: Secondary | ICD-10-CM

## 2020-04-27 NOTE — Telephone Encounter (Signed)
Revealed negative genetic testing.  Discussed that we do not know why she has breast cancer or why there is cancer in the family. It could be due to a different gene that we are not testing, or maybe our current technology may not be able to pick something up.  It will be important for her to keep in contact with genetics to keep up with whether additional testing may be needed.  Discussed that other family members should consider getting tested, as there may be a hereditary syndrome in the family that the patient did not inherit.

## 2020-04-27 NOTE — Progress Notes (Signed)
HPI:  Julia Fernandez was previously seen in the [facility] clinic due to a personal and family history of cancer and concerns regarding a hereditary predisposition to cancer. Please refer to our prior cancer genetics clinic note for more information regarding our discussion, assessment and recommendations, at the time. Julia Fernandez's recent genetic test results were disclosed to her, as were recommendations warranted by these results. These results and recommendations are discussed in more detail below.  CANCER HISTORY:  Oncology History  Malignant neoplasm of upper-outer quadrant of right breast in female, estrogen receptor positive ([place])  12/2019 Initial Diagnosis   Palpated a right breast mass in 12/2019. Mammogram showed a 1.1cm mass at the 12 o'clock position in the right breast. Biopsy showed invasive ductal carcinoma, grade 2, ER+, PR-, HER-2 negative, Ki67 5%   03/17/2020 Surgery   Right lumpectomy: Grade 2 IDC, 1.1 cm, margins negative, 0/4 lymph nodes negative, ER 95%, PR 15%, HER-2 equivocal by IHC, FISH negative   04/22/2020 Genetic Testing   Negative genetic testing on the common hereditary cancer panel + RNA.  The Common Hereditary Gene Panel offered by [facility] includes sequencing and/or deletion duplication testing of the following 47 genes: APC, ATM, AXIN2, BARD1, BMPR1A, BRCA1, BRCA2, BRIP1, CDH1, CDK4, CDKN2A (p14ARF), CDKN2A (p16INK4a), CHEK2, CTNNA1, DICER1, EPCAM (Deletion/duplication testing only), GREM1 (promoter region deletion/duplication testing only), KIT, MEN1, MLH1, MSH2, MSH3, MSH6, MUTYH, NBN, NF1, NHTL1, PALB2, PDGFRA, PMS2, POLD1, POLE, PTEN, RAD50, RAD51C, RAD51D, SDHB, SDHC, SDHD, SMAD4, SMARCA4. STK11, TP53, TSC1, TSC2, and VHL.  The following genes were evaluated for sequence changes only: SDHA and HOXB13 c.251G>A variant only. The report date is April 22, 2020.     FAMILY HISTORY:  We obtained a detailed, 4-generation family history.  Significant  diagnoses are listed below: Family History  Problem Relation Age of Onset  . Dementia Mother   . Heart attack Father   . Pancreatic cancer Sister   . Hodgkin's lymphoma Maternal Uncle   . Breast cancer Sister        dx 53s  . Lung cancer Sister   . Breast cancer Sister        d. 18  . Colon cancer Half-Sister   . Colon cancer Niece        d. 5    The patient has a son and daughter who are cancer free.  She has four full sisters, and two paternal half brothers and a half sister.  One sister had pancreatic cancer, two sisters had breast cancer, one sister who did not have cancer had a daughter who died of colon cancer at 73.  Her paternal half sister had colon cancer.  Both parents are deceased.  The patients mother died of dementia.  She had multiple siblings, one who had Hodgkin's Lymphoma.  There is no other known cancer on this side.  The patient's father died of heart disease.  There is very little information known about his family.  Julia Fernandez is unaware of previous family history of genetic testing for hereditary cancer risks. Patient's maternal ancestors are of Caucasian descent, and paternal ancestors are of [place] and [place] descent. There is no reported Ashkenazi Jewish ancestry. There is no known consanguinity.  GENETIC TEST RESULTS: Genetic testing reported out on April 22, 2020 through the common hereditary cancer panel found no pathogenic mutations. The Common Hereditary Gene Panel offered by [facility] includes sequencing and/or deletion duplication testing of the following 47 genes: APC, ATM, AXIN2, BARD1, BMPR1A,  BRCA1, BRCA2, BRIP1, CDH1, CDK4, CDKN2A (p14ARF), CDKN2A (p16INK4a), CHEK2, CTNNA1, DICER1, EPCAM (Deletion/duplication testing only), GREM1 (promoter region deletion/duplication testing only), KIT, MEN1, MLH1, MSH2, MSH3, MSH6, MUTYH, NBN, NF1, NHTL1, PALB2, PDGFRA, PMS2, POLD1, POLE, PTEN, RAD50, RAD51C, RAD51D, SDHB, SDHC, SDHD, SMAD4, SMARCA4.  STK11, TP53, TSC1, TSC2, and VHL.  The following genes were evaluated for sequence changes only: SDHA and HOXB13 c.251G>A variant only. . The test report has been scanned into EPIC and is located under the Molecular Pathology section of the Results Review tab.  A portion of the result report is included below for reference.     We discussed with Julia Fernandez that because current genetic testing is not perfect, it is possible there may be a gene mutation in one of these genes that current testing cannot detect, but that chance is small.  We also discussed, that there could be another gene that has not yet been discovered, or that we have not yet tested, that is responsible for the cancer diagnoses in the family. It is also possible there is a hereditary cause for the cancer in the family that Julia Fernandez did not inherit and therefore was not identified in her testing.  Therefore, it is important to remain in touch with cancer genetics in the future so that we can continue to offer Julia Fernandez the most up to date genetic testing.   ADDITIONAL GENETIC TESTING: We discussed with Julia Fernandez that there are other genes that are associated with increased cancer risk that can be analyzed. Should Julia Fernandez wish to pursue additional genetic testing, we are happy to discuss and coordinate this testing, at any time.    CANCER SCREENING RECOMMENDATIONS: Julia Fernandez's test result is considered negative (normal).  This means that we have not identified a hereditary cause for her personal and family history of cancer at this time. Most cancers happen by chance and this negative test suggests that her cancer may fall into this category.    While reassuring, this does not definitively rule out a hereditary predisposition to cancer. It is still possible that there could be genetic mutations that are undetectable by current technology. There could be genetic mutations in genes that have not been tested or identified to increase  cancer risk.  Therefore, it is recommended she continue to follow the cancer management and screening guidelines provided by her oncology and primary healthcare provider.   An individual's cancer risk and medical management are not determined by genetic test results alone. Overall cancer risk assessment incorporates additional factors, including personal medical history, family history, and any available genetic information that may result in a personalized plan for cancer prevention and surveillance  RECOMMENDATIONS FOR FAMILY MEMBERS:  Individuals in this family might be at some increased risk of developing cancer, over the general population risk, simply due to the family history of cancer.  We recommended women in this family have a yearly mammogram beginning at age 48, or 51 years younger than the earliest onset of cancer, an annual clinical breast exam, and perform monthly breast self-exams. Women in this family should also have a gynecological exam as recommended by their primary provider. All family members should be referred for colonoscopy starting at age 38.   It is also possible there is a hereditary cause for the cancer in Julia Fernandez's family that she did not inherit and therefore was not identified in her.  Based on Julia Fernandez's family history, we recommended her sister, who was diagnosed with breast  cancer, have genetic counseling and testing. Julia Fernandez will let us know if we can be of any assistance in coordinating genetic counseling and/or testing for this family member.   FOLLOW-UP: Lastly, we discussed with Julia Fernandez that cancer genetics is a rapidly advancing field and it is possible that new genetic tests will be appropriate for her and/or her family members in the future. We encouraged her to remain in contact with cancer genetics on an annual basis so we can update her personal and family histories and let her know of advances in cancer genetics that may benefit this family.   Our  contact number was provided. Julia Fernandez's questions were answered to her satisfaction, and she knows she is welcome to call us at anytime with additional questions or concerns.   Roma Kayser, [place], [facility] Licensed, Certified Genetic Counselor [email or web address]

## 2020-04-28 ENCOUNTER — Telehealth: Payer: Self-pay | Admitting: *Deleted

## 2020-04-28 NOTE — Telephone Encounter (Signed)
Received phone call from [facility] @ Dr. Marland Kitchen Office stating that this appt. might not be until the end of Jan. due to the doctor having Covid, faxed notes on 04-28-20

## 2020-04-28 NOTE — Telephone Encounter (Signed)
xxxx 

## 2020-05-02 ENCOUNTER — Telehealth: Payer: Self-pay | Admitting: *Deleted

## 2020-05-02 NOTE — Telephone Encounter (Signed)
Spoke with Mrs. Oddo to let her know that we have faxed all of her information to [facility] and have received confirmation of receipt.  She was informed that the physician is out of the office and it may be a while before she can get an appointment.  I let her know they would contact her for an appointment.  She verbalized understanding.  Coralyn Helling. Vickii Chafe, BSN

## 2020-06-28 NOTE — Progress Notes (Incomplete)
Patient Care Team: Michell Heinrich, DO as PCP - General (Family Medicine) Mauro Kaufmann, RN as Oncology Nurse Navigator Rockwell Germany, RN as Oncology Nurse Navigator  DIAGNOSIS:    ICD-10-CM   1. Malignant neoplasm of upper-outer quadrant of right breast in female, estrogen receptor positive ([place])  C50.411    Z17.0     SUMMARY OF ONCOLOGIC HISTORY: Oncology History  Malignant neoplasm of upper-outer quadrant of right breast in female, estrogen receptor positive ([place])  12/2019 Initial Diagnosis   Palpated a right breast mass in 12/2019. Mammogram showed a 1.1cm mass at the 12 o'clock position in the right breast. Biopsy showed invasive ductal carcinoma, grade 2, ER+, PR-, HER-2 negative, Ki67 5%   03/17/2020 Surgery   Right lumpectomy: Grade 2 IDC, 1.1 cm, margins negative, 0/4 lymph nodes negative, ER 95%, PR 15%, HER-2 equivocal by IHC, FISH negative   04/22/2020 Genetic Testing   Negative genetic testing on the common hereditary cancer panel + RNA.  The Common Hereditary Gene Panel offered by [facility] includes sequencing and/or deletion duplication testing of the following 47 genes: APC, ATM, AXIN2, BARD1, BMPR1A, BRCA1, BRCA2, BRIP1, CDH1, CDK4, CDKN2A (p14ARF), CDKN2A (p16INK4a), CHEK2, CTNNA1, DICER1, EPCAM (Deletion/duplication testing only), GREM1 (promoter region deletion/duplication testing only), KIT, MEN1, MLH1, MSH2, MSH3, MSH6, MUTYH, NBN, NF1, NHTL1, PALB2, PDGFRA, PMS2, POLD1, POLE, PTEN, RAD50, RAD51C, RAD51D, SDHB, SDHC, SDHD, SMAD4, SMARCA4. STK11, TP53, TSC1, TSC2, and VHL.  The following genes were evaluated for sequence changes only: SDHA and HOXB13 c.251G>A variant only. The report date is April 22, 2020.   03/2020 - 04/2020 Radiation Therapy   Adjuvant radiation at [facility] COMPLIANT: Follow-up to discuss antiestrogen therapy   INTERVAL HISTORY: Julia Fernandez is a 80 y.o. with above-mentioned history of invasive ductal carcinoma of the right breast  who underwent right lumpectomy and completed radiation at [facility]. She presents to the clinic today to discuss antiestrogen therapy.   ALLERGIES:  is allergic to codeine, fish allergy, penicillins, and prednisone.  MEDICATIONS:  Current Outpatient Medications  Medication Sig Dispense Refill  . amLODipine (NORVASC) 5 MG tablet Take 5 mg by mouth in the morning and at bedtime.     Marland Kitchen anastrozole (ARIMIDEX) 1 MG tablet Take 1 tablet (1 mg total) by mouth daily. 90 tablet 3  . aspirin EC 81 MG tablet Take 81 mg by mouth daily. Swallow whole.    . Cholecalciferol (VITAMIN D3 SUPER STRENGTH) 50 MCG (2000 UT) TABS Take 2,000 Units by mouth daily.    . hydrochlorothiazide (HYDRODIURIL) 12.5 MG tablet Take 12.5 mg by mouth daily.    Marland Kitchen losartan (COZAAR) 100 MG tablet Take 100 mg by mouth daily.    . metoprolol tartrate (LOPRESSOR) 25 MG tablet Take 25 mg by mouth daily.    . rosuvastatin (CRESTOR) 20 MG tablet Take 20 mg by mouth daily.      No current facility-administered medications for this visit.    PHYSICAL EXAMINATION: ECOG PERFORMANCE STATUS: {CHL ONC ECOG PS:[phone]}  There were no vitals filed for this visit. There were no vitals filed for this visit.  LABORATORY DATA:  I have reviewed the data as listed CMP Latest Ref Rng & Units 03/10/2020  Glucose 70 - 99 mg/dL 120(H)  BUN 8 - 23 mg/dL 20  Creatinine 0.44 - 1.00 mg/dL 1.31(H)  Sodium 135 - 145 mmol/L 133(L)  Potassium 3.5 - 5.1 mmol/L 3.2(L)  Chloride 98 - 111 mmol/L 95(L)  CO2 22 -  32 mmol/L 25  Calcium 8.9 - 10.3 mg/dL 10.0  Total Protein 6.5 - 8.1 g/dL 7.3  Total Bilirubin 0.3 - 1.2 mg/dL 0.8  Alkaline Phos 38 - 126 U/L 37(L)  AST 15 - 41 U/L 25  ALT 0 - 44 U/L 17    Lab Results  Component Value Date   WBC 8.9 03/10/2020   HGB 12.9 03/10/2020   HCT 38.1 03/10/2020   MCV 87.4 03/10/2020   PLT 281 03/10/2020   NEUTROABS 6.0 03/10/2020    ASSESSMENT & PLAN:  No problem-specific Assessment & Plan notes found  for this encounter.    No orders of the defined types were placed in this encounter.  The patient has a good understanding of the overall plan. she agrees with it. she will call with any problems that may develop before the next visit here.  Total time spent: *** mins including face to face time and time spent for planning, charting and coordination of care  Rulon Eisenmenger, MD, MPH 06/28/2020  I, Molly Dorshimer, am acting as scribe for Dr. Nicholas Lose.  {insert scribe attestation}

## 2020-06-28 NOTE — Assessment & Plan Note (Deleted)
Palpable mass in the right breast 12/2019. Mammogram showed a 1.1cm mass at the 12 o'clock position in the right breast. Biopsy showed invasive ductal carcinoma, grade 2, ER+, PR-, HER-2 negative, Ki67 5%  03/17/2020: Right lumpectomy: Grade 2 IDC, 1.1 cm, margins negative, 0/4 lymph nodes negative, ER 95%, PR 15%, HER-2 equivocal by IHC, FISH negative  Treatment plan: 1.  adjuvant radiation therapy 2.  Followed adjuvant antiestrogen therapy with anastrozole 1 mg daily x5 years  Anastrozole Toxicities:  Breast Cancer Surveillance:

## 2020-06-29 ENCOUNTER — Inpatient Hospital Stay: Payer: Medicare Other | Admitting: Hematology and Oncology

## 2020-06-29 DIAGNOSIS — C50411 Malignant neoplasm of upper-outer quadrant of right female breast: Secondary | ICD-10-CM

## 2020-07-26 NOTE — Progress Notes (Signed)
HEMATOLOGY-ONCOLOGY MYCHART VIDEO VISIT PROGRESS NOTE  I connected with Julia Fernandez on 07/27/2020 at 11:00 AM EDT by MyChart video conference and verified that I am speaking with the correct person using two identifiers.  I discussed the limitations, risks, security and privacy concerns of performing an evaluation and management service by MyChart and the availability of in person appointments.  I also discussed with the patient that there may be a patient responsible charge related to this service. The patient expressed understanding and agreed to proceed.  Patient's Location: Home Physician Location: Clinic  CHIEF COMPLIANT: Follow-up of right breast cancer on anastrozole   INTERVAL HISTORY: Julia Fernandez is a 80 y.o. female with above-mentioned history of right breast cancer who underwent a right lumpectomy, radiation, and is currently on antiestrogen therapy with anastrozole. She presents over MyChart today for follow-up.  She is tolerating anastrozole extremely well without any problems or concerns.  She has done well with radiation    Oncology History  Malignant neoplasm of upper-outer quadrant of right breast in female, estrogen receptor positive ([place])  12/2019 Initial Diagnosis   Palpated a right breast mass in 12/2019. Mammogram showed a 1.1cm mass at the 12 o'clock position in the right breast. Biopsy showed invasive ductal carcinoma, grade 2, ER+, PR-, HER-2 negative, Ki67 5%   03/17/2020 Surgery   Right lumpectomy: Grade 2 IDC, 1.1 cm, margins negative, 0/4 lymph nodes negative, ER 95%, PR 15%, HER-2 equivocal by IHC, FISH negative   04/22/2020 Genetic Testing   Negative genetic testing on the common hereditary cancer panel + RNA.  The Common Hereditary Gene Panel offered by [facility] includes sequencing and/or deletion duplication testing of the following 47 genes: APC, ATM, AXIN2, BARD1, BMPR1A, BRCA1, BRCA2, BRIP1, CDH1, CDK4, CDKN2A (p14ARF), CDKN2A (p16INK4a), CHEK2, CTNNA1,  DICER1, EPCAM (Deletion/duplication testing only), GREM1 (promoter region deletion/duplication testing only), KIT, MEN1, MLH1, MSH2, MSH3, MSH6, MUTYH, NBN, NF1, NHTL1, PALB2, PDGFRA, PMS2, POLD1, POLE, PTEN, RAD50, RAD51C, RAD51D, SDHB, SDHC, SDHD, SMAD4, SMARCA4. STK11, TP53, TSC1, TSC2, and VHL.  The following genes were evaluated for sequence changes only: SDHA and HOXB13 c.251G>A variant only. The report date is April 22, 2020.   06/27/2020 - 07/22/2020 Radiation Therapy   Adjuvant radiation at [facility]   04/02/2021 -  Anti-estrogen oral therapy   Anastrozole      Observations/Objective:  There were no vitals filed for this visit. There is no height or weight on file to calculate BMI.  I have reviewed the data as listed CMP Latest Ref Rng & Units 03/10/2020  Glucose 70 - 99 mg/dL 120(H)  BUN 8 - 23 mg/dL 20  Creatinine 0.44 - 1.00 mg/dL 1.31(H)  Sodium 135 - 145 mmol/L 133(L)  Potassium 3.5 - 5.1 mmol/L 3.2(L)  Chloride 98 - 111 mmol/L 95(L)  CO2 22 - 32 mmol/L 25  Calcium 8.9 - 10.3 mg/dL 10.0  Total Protein 6.5 - 8.1 g/dL 7.3  Total Bilirubin 0.3 - 1.2 mg/dL 0.8  Alkaline Phos 38 - 126 U/L 37(L)  AST 15 - 41 U/L 25  ALT 0 - 44 U/L 17    Lab Results  Component Value Date   WBC 8.9 03/10/2020   HGB 12.9 03/10/2020   HCT 38.1 03/10/2020   MCV 87.4 03/10/2020   PLT 281 03/10/2020   NEUTROABS 6.0 03/10/2020      Assessment Plan:  Malignant neoplasm of upper-outer quadrant of right breast in female, estrogen receptor positive ([place]) Palpable mass in the right breast 12/2019.  Mammogram showed a 1.1cm mass at the 12 o'clock position in the right breast. Biopsy showed invasive ductal carcinoma, grade 2, ER+, PR-, HER-2 negative, Ki67 5%  03/17/2020: Right lumpectomy: Grade 2 IDC, 1.1 cm, margins negative, 0/4 lymph nodes negative, ER 95%, PR 15%, HER-2 equivocal by IHC, FISH negative  Treatment plan: 1.  adjuvant radiation therapy started 06/27/2020 completed  07/22/2020 2.  Followed adjuvant antiestrogen therapy with anastrozole 1 mg daily x5 years started 04/02/2021  Anastrozole Toxicities: She is tolerating anastrozole extremely well without any problems or concerns. Denies any hot flashes or myalgias.  Breast Cancer Surveillance: Mammograms will be arranged in [place] in October 2022. We will try to obtain a copy of her bone density from her primary care physician Dr. Marveen Reeks  Return to clinic in 1 year for follow-up    I discussed the assessment and treatment plan with the patient. The patient was provided an opportunity to ask questions and all were answered. The patient agreed with the plan and demonstrated an understanding of the instructions. The patient was advised to call back or seek an in-person evaluation if the symptoms worsen or if the condition fails to improve as anticipated.   Total time spent: 20 minutes including face-to-face MyChart video visit time and time spent for planning, charting and coordination of care  Rulon Eisenmenger, MD 07/27/2020   I, Cloyde Reams Dorshimer, am acting as scribe for Nicholas Lose, MD.  I have reviewed the above documentation for accuracy and completeness, and I agree with the above.

## 2020-07-26 NOTE — Assessment & Plan Note (Addendum)
Palpable mass in the right breast 12/2019. Mammogram showed a 1.1cm mass at the 12 o'clock position in the right breast. Biopsy showed invasive ductal carcinoma, grade 2, ER+, PR-, HER-2 negative, Ki67 5%  03/17/2020: Right lumpectomy: Grade 2 IDC, 1.1 cm, margins negative, 0/4 lymph nodes negative, ER 95%, PR 15%, HER-2 equivocal by IHC, FISH negative  Treatment plan: 1.  adjuvant radiation therapy started 06/27/2020 completed 07/22/2020 2.  Followed adjuvant antiestrogen therapy with anastrozole 1 mg daily x5 years started 04/02/2021  Anastrozole Toxicities: She is tolerating anastrozole extremely well without any problems or concerns. Denies any hot flashes or myalgias.  Breast Cancer Surveillance: Mammograms will be arranged in [place] in October 2022. We will try to obtain a copy of her bone density from her primary care physician Dr. Marveen Reeks  Return to clinic in 1 year for follow-up

## 2020-07-27 ENCOUNTER — Other Ambulatory Visit: Payer: Self-pay | Admitting: *Deleted

## 2020-07-27 ENCOUNTER — Inpatient Hospital Stay: Payer: Medicare Other | Attending: Hematology and Oncology | Admitting: Hematology and Oncology

## 2020-07-27 DIAGNOSIS — Z170 Estrogen receptor positive status [ER+]: Secondary | ICD-10-CM

## 2020-07-27 DIAGNOSIS — C50411 Malignant neoplasm of upper-outer quadrant of right female breast: Secondary | ICD-10-CM

## 2020-07-27 NOTE — Progress Notes (Signed)
Per MD request, order for mammogram successfully faxed to [facility] [phone]).

## 2020-10-20 ENCOUNTER — Telehealth: Payer: Self-pay | Admitting: Hematology and Oncology

## 2020-10-20 NOTE — Telephone Encounter (Signed)
Scheduled appointment per 06/23 sch msg. Left message.

## 2021-01-30 ENCOUNTER — Encounter: Payer: Self-pay | Admitting: Hematology and Oncology

## 2021-03-14 ENCOUNTER — Other Ambulatory Visit: Payer: Self-pay | Admitting: Hematology and Oncology

## 2021-03-21 IMAGING — MG MM BREAST LOCALIZATION CLIP
4 series · 4 of 12 positions shown · non-contrast
Comparison: Prior outside studies.

CLINICAL DATA: 78-year-old female for radioactive seed localization
of RIGHT breast cancer prior to lumpectomy.

EXAM:
ULTRASOUND GUIDED RADIOACTIVE SEED LOCALIZATION OF THE RIGHT BREAST
DIAGNOSTIC RIGHT MAMMOGRAM POST ULTRASOUND-GUIDED RADIOACTIVE SEED
PLACEMENT

[R CC synth-2D]
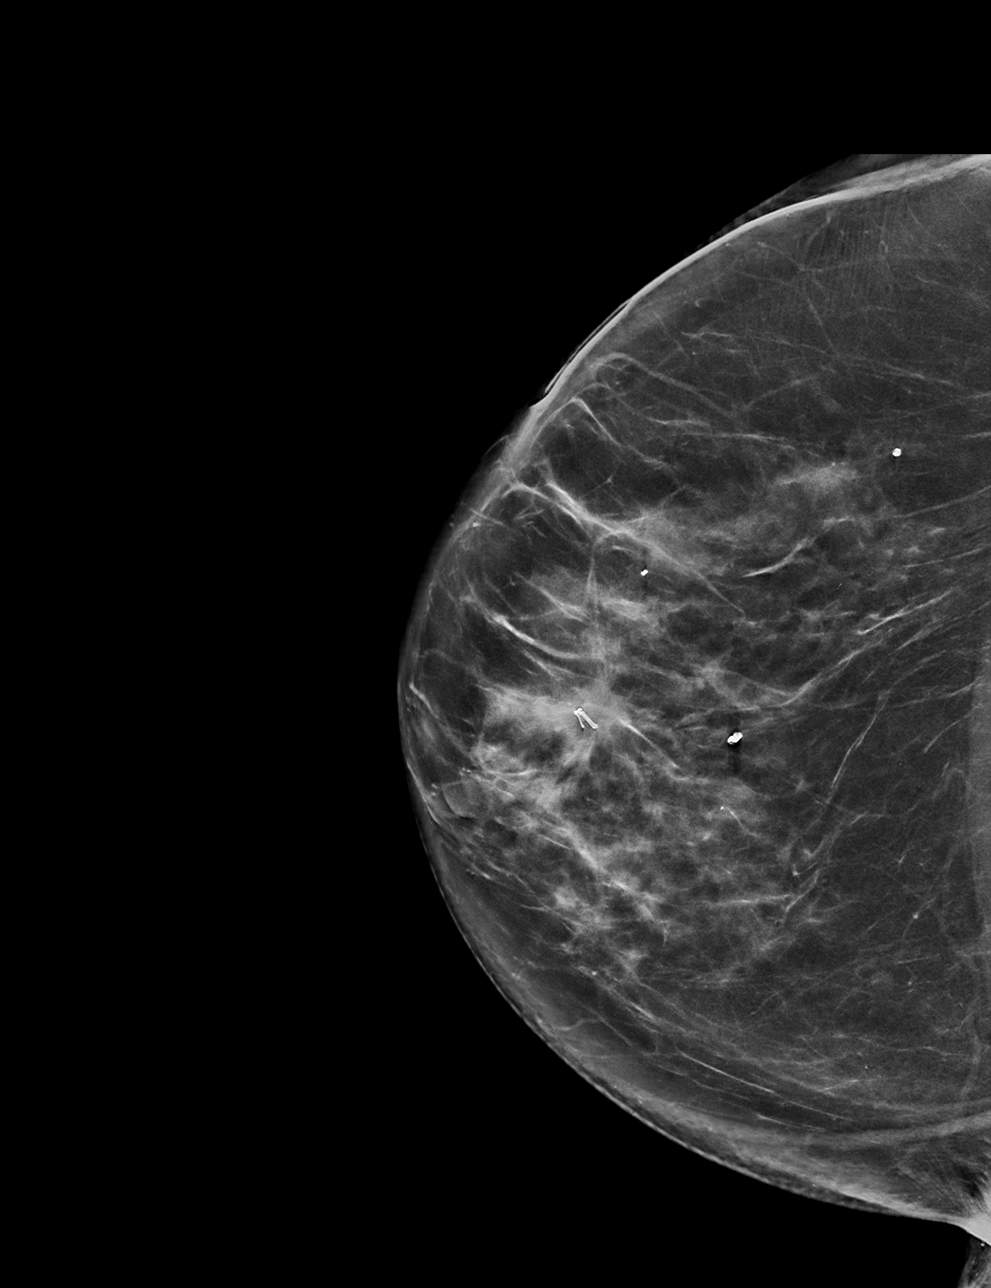

[R ML synth-2D]
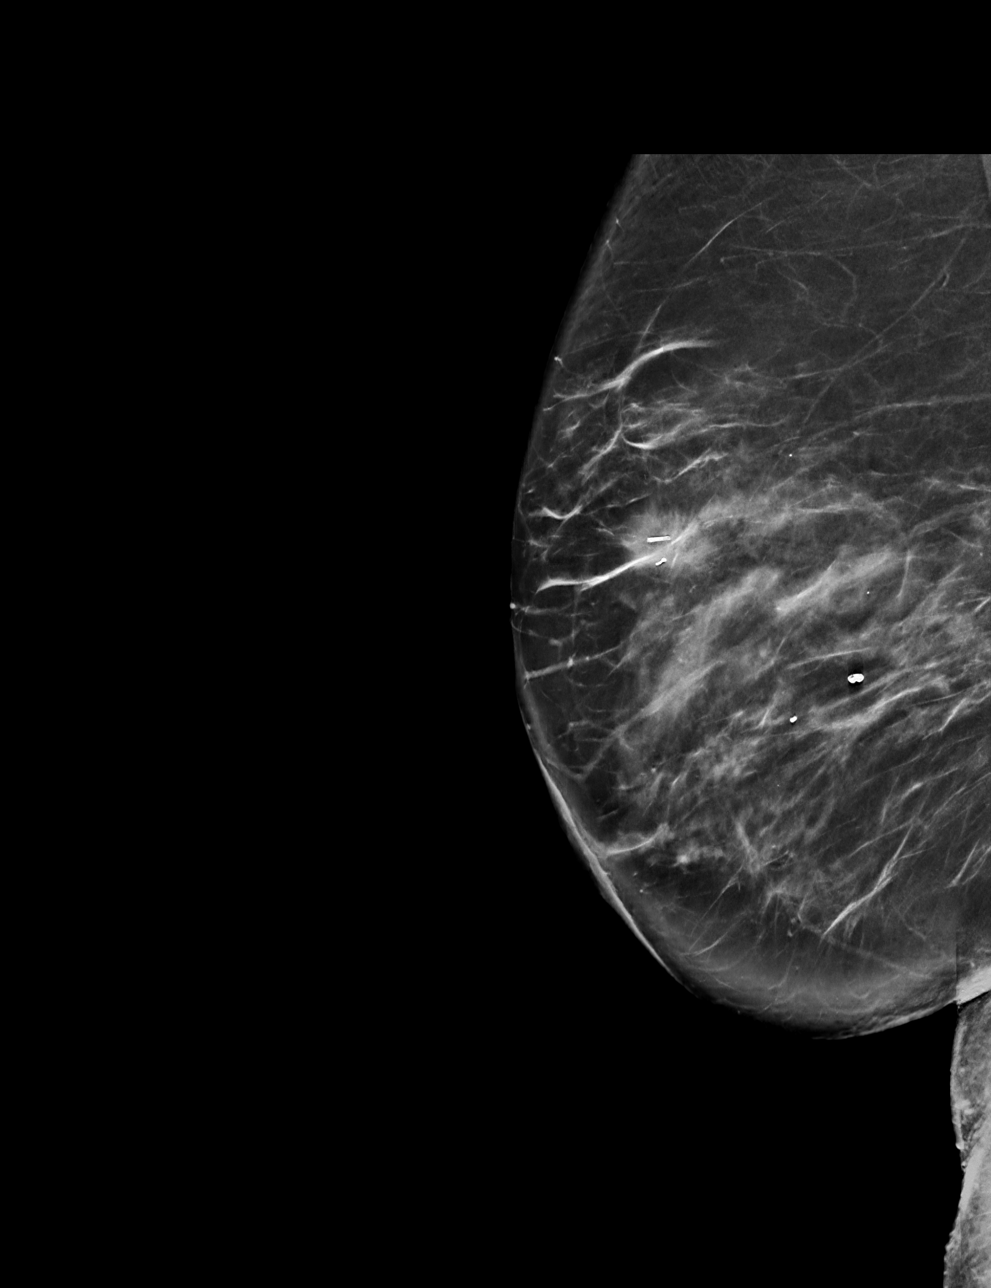

[R CC tomo · tomo slice 37/74.0]
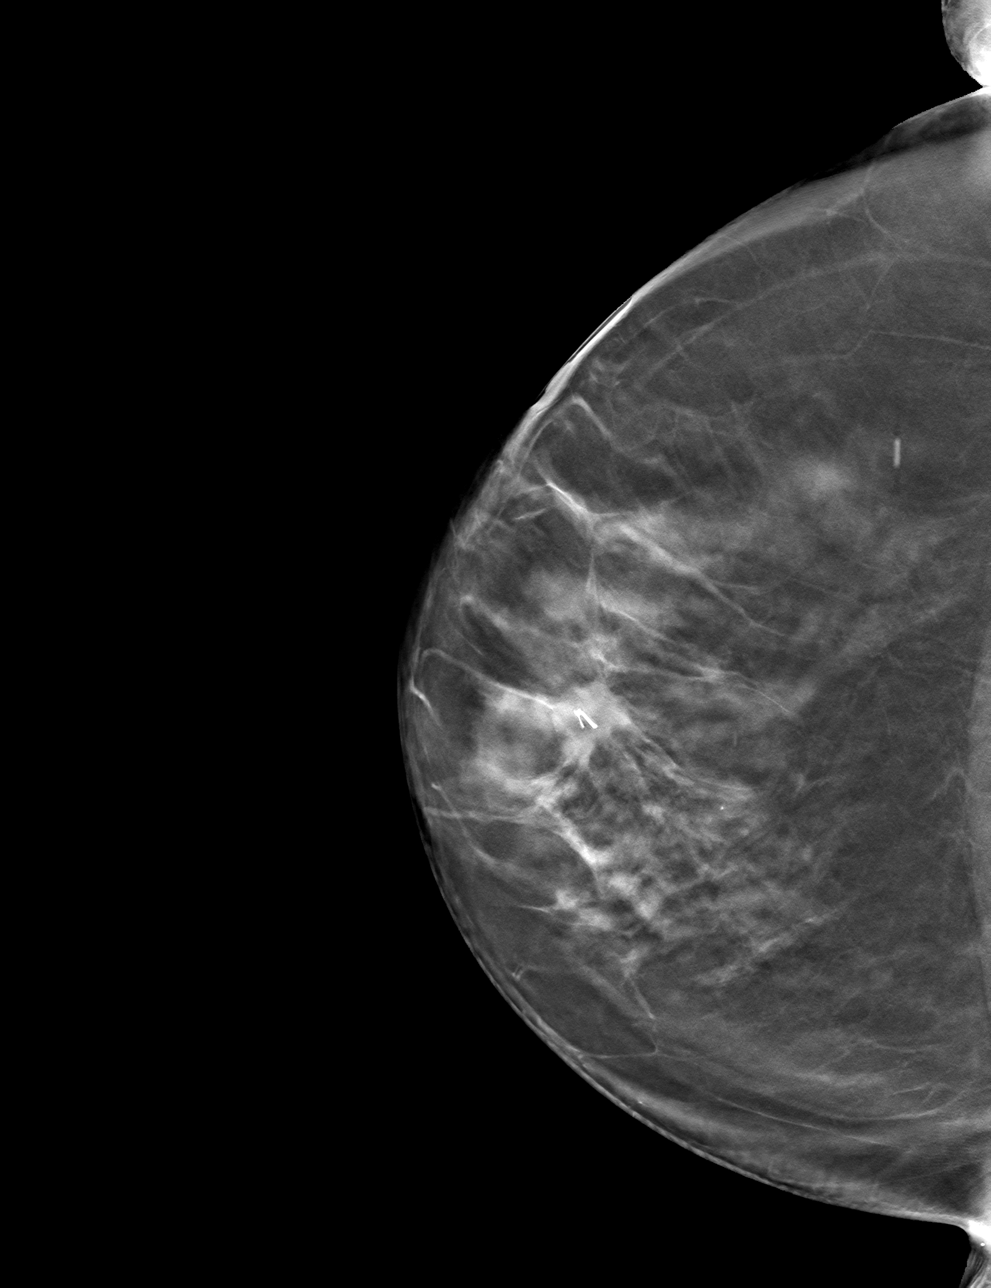

[R ML tomo · tomo slice 43/85.0]
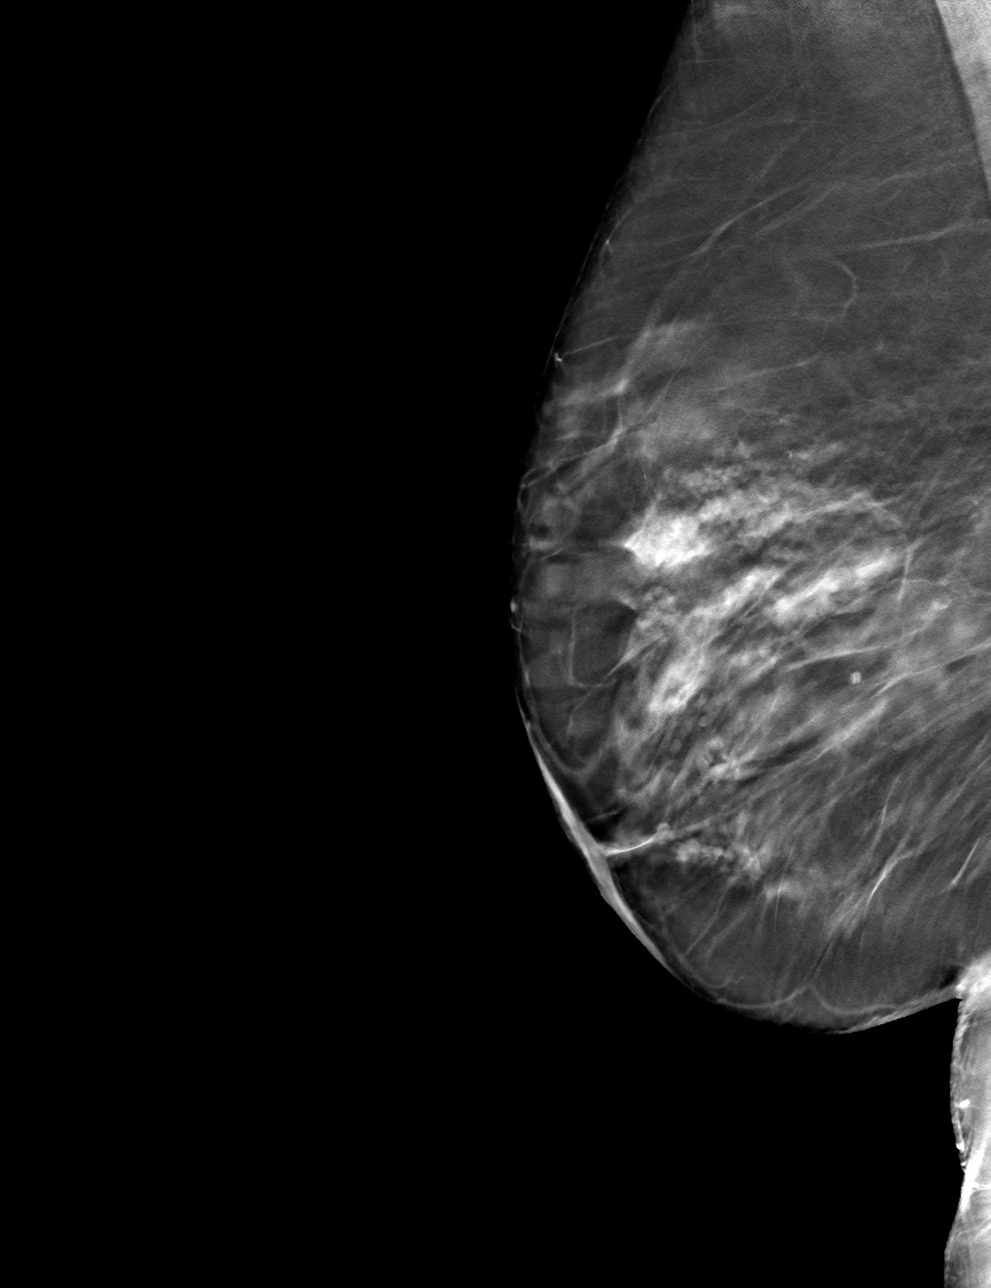

[4 of 12 positions shown; findings below may reference images not displayed]

FINDINGS: Patient presents for radioactive seed localization prior to RIGHT
lumpectomy. I met with the patient and we discussed the procedure of
seed localization including benefits and alternatives. We discussed
the high likelihood of a successful procedure. We discussed the
risks of the procedure including infection, bleeding, tissue injury
and further surgery. We discussed the low dose of radioactivity
involved in the procedure. Informed, written consent was given.

The usual time-out protocol was performed immediately prior to the
procedure.

Using ultrasound guidance, sterile technique, 1% lidocaine and an
Q-[id number] radioactive seed, the 1.3 cm mass containing biopsy clip at
the 12 o'clock position of the RIGHT breast was localized using a
LATERAL approach.

Follow-up survey of the patient confirms presence of the radioactive
seed.

Order number of Q-G76 seed:  [id number].

Total activity:  0.260 millicuries.  Reference Date: 03/09/2020.

Mammographic images were obtained following ultrasound-guided
radioactive seed placement confirming the seed in the expected
location within the mass and adjacent to the biopsy clip and were
marked for Dr. Samba.

The patient tolerated the procedure well and was released from the
[REDACTED]. She was given instructions regarding seed removal.
IMPRESSION: Radioactive seed localization RIGHT breast. No apparent
complications.

## 2021-03-21 IMAGING — US US NEEDLE LOCALIZATION*R*
1 series · 3 of 3 positions shown · non-contrast
Comparison: Prior outside studies.

CLINICAL DATA: 78-year-old female for radioactive seed localization
of RIGHT breast cancer prior to lumpectomy.

EXAM:
ULTRASOUND GUIDED RADIOACTIVE SEED LOCALIZATION OF THE RIGHT BREAST
DIAGNOSTIC RIGHT MAMMOGRAM POST ULTRASOUND-GUIDED RADIOACTIVE SEED
PLACEMENT

[Series 1: us needle localization*right* · 0.06mm/px · 3 of 3 slices shown]
[im 1/3]
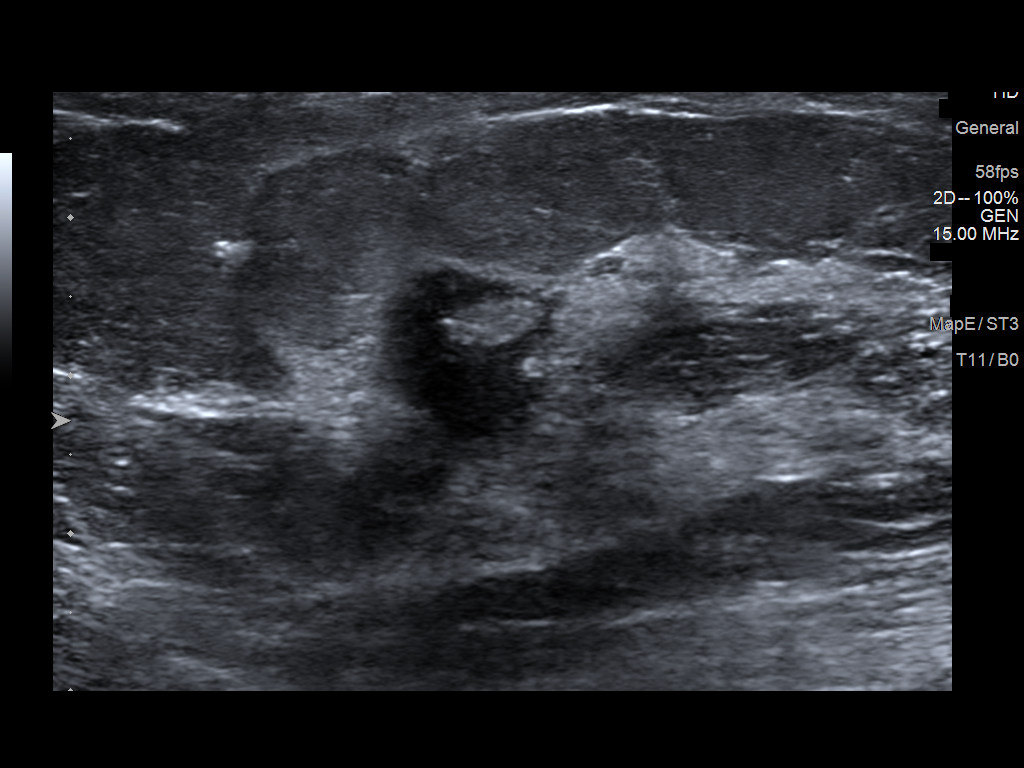
[im 2/3]
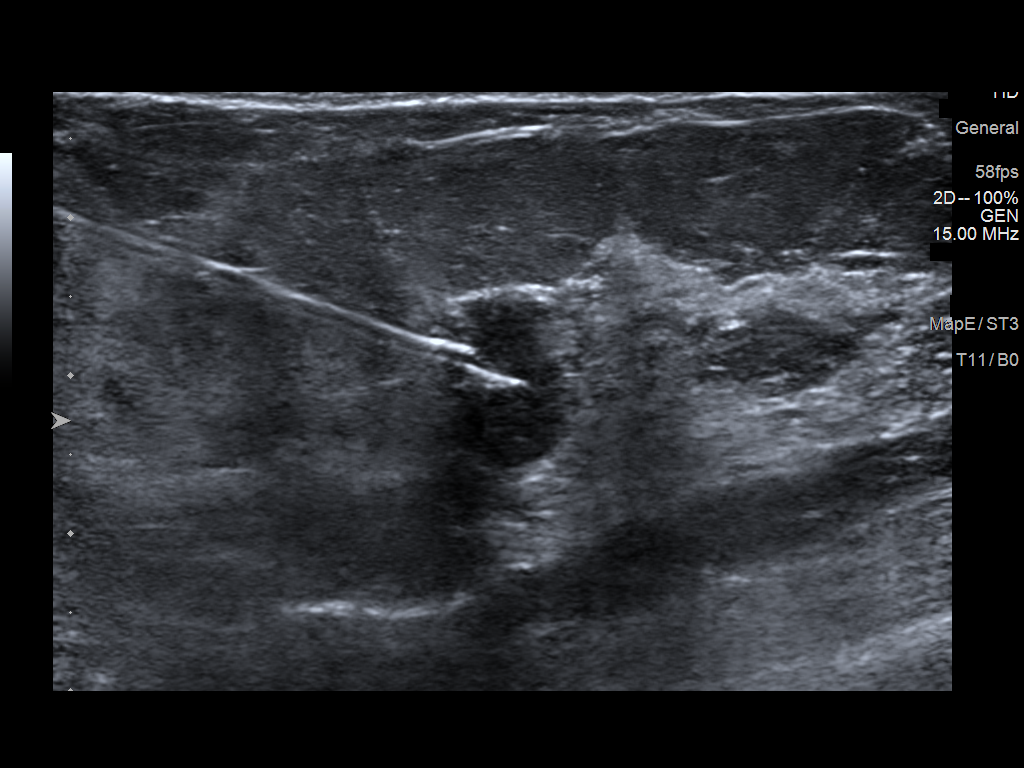
[im 3/3]
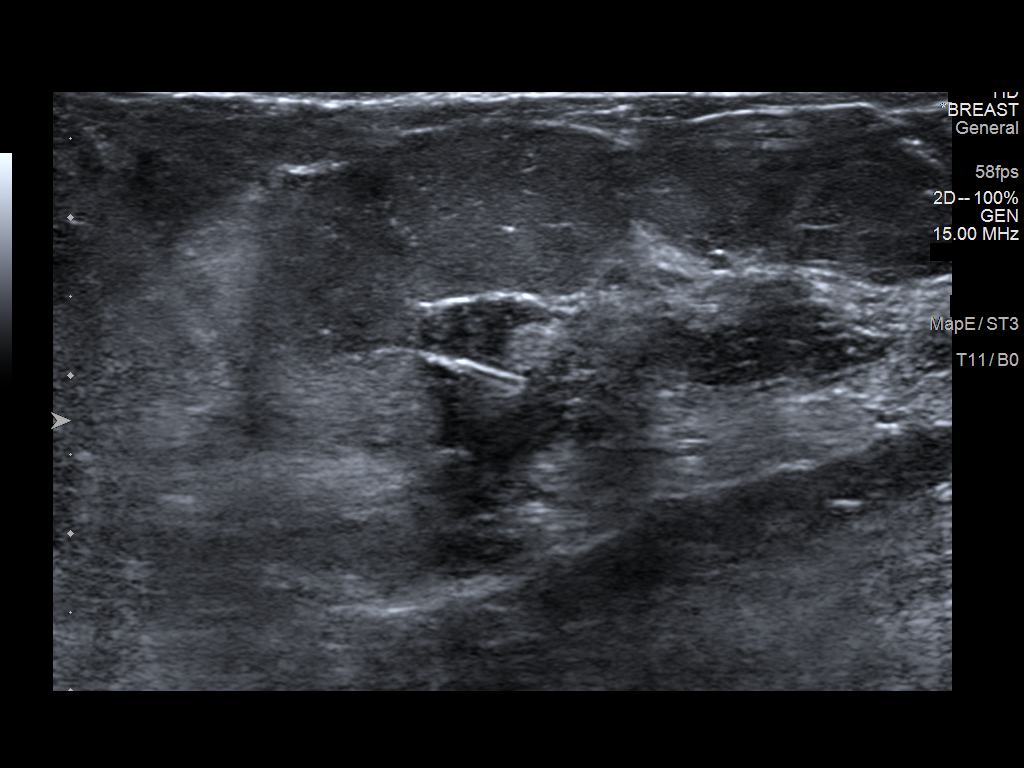

[3 of 3 positions shown; findings below may reference images not displayed]

FINDINGS: Patient presents for radioactive seed localization prior to RIGHT
lumpectomy. I met with the patient and we discussed the procedure of
seed localization including benefits and alternatives. We discussed
the high likelihood of a successful procedure. We discussed the
risks of the procedure including infection, bleeding, tissue injury
and further surgery. We discussed the low dose of radioactivity
involved in the procedure. Informed, written consent was given.

The usual time-out protocol was performed immediately prior to the
procedure.

Using ultrasound guidance, sterile technique, 1% lidocaine and an
Q-G76 radioactive seed, the 1.3 cm mass containing biopsy clip at
the 12 o'clock position of the RIGHT breast was localized using a
LATERAL approach.

Follow-up survey of the patient confirms presence of the radioactive
seed.

Order number of Q-G76 seed:  [id number].

Total activity:  0.260 millicuries.  Reference Date: 03/09/2020.

Mammographic images were obtained following ultrasound-guided
radioactive seed placement confirming the seed in the expected
location within the mass and adjacent to the biopsy clip and were
marked for Dr. Samba.

The patient tolerated the procedure well and was released from the
[REDACTED]. She was given instructions regarding seed removal.
IMPRESSION: Radioactive seed localization RIGHT breast. No apparent
complications.

## 2021-04-10 NOTE — Progress Notes (Signed)
Patient Care Team: Michell Heinrich, DO as PCP - General (Family Medicine) Mauro Kaufmann, RN as Oncology Nurse Navigator Rockwell Germany, RN as Oncology Nurse Navigator  DIAGNOSIS:    ICD-10-CM   1. Malignant neoplasm of upper-outer quadrant of right breast in female, estrogen receptor positive ([place])  C50.411    Z17.0       SUMMARY OF ONCOLOGIC HISTORY: Oncology History  Malignant neoplasm of upper-outer quadrant of right breast in female, estrogen receptor positive ([place])  12/2019 Initial Diagnosis   Palpated a right breast mass in 12/2019. Mammogram showed a 1.1cm mass at the 12 o'clock position in the right breast. Biopsy showed invasive ductal carcinoma, grade 2, ER+, PR-, HER-2 negative, Ki67 5%   03/17/2020 Surgery   Right lumpectomy: Grade 2 IDC, 1.1 cm, margins negative, 0/4 lymph nodes negative, ER 95%, PR 15%, HER-2 equivocal by IHC, FISH negative   04/22/2020 Genetic Testing   Negative genetic testing on the common hereditary cancer panel + RNA.  The Common Hereditary Gene Panel offered by [facility] includes sequencing and/or deletion duplication testing of the following 47 genes: APC, ATM, AXIN2, BARD1, BMPR1A, BRCA1, BRCA2, BRIP1, CDH1, CDK4, CDKN2A (p14ARF), CDKN2A (p16INK4a), CHEK2, CTNNA1, DICER1, EPCAM (Deletion/duplication testing only), GREM1 (promoter region deletion/duplication testing only), KIT, MEN1, MLH1, MSH2, MSH3, MSH6, MUTYH, NBN, NF1, NHTL1, PALB2, PDGFRA, PMS2, POLD1, POLE, PTEN, RAD50, RAD51C, RAD51D, SDHB, SDHC, SDHD, SMAD4, SMARCA4. STK11, TP53, TSC1, TSC2, and VHL.  The following genes were evaluated for sequence changes only: SDHA and HOXB13 c.251G>A variant only. The report date is April 22, 2020.   06/27/2020 - 07/22/2020 Radiation Therapy   Adjuvant radiation at [facility]   04/02/2021 -  Anti-estrogen oral therapy   Anastrozole      CHIEF COMPLIANT: Follow-up of right breast cancer on anastrozole   INTERVAL HISTORY: Julia Fernandez is a 80 y.o.  with above-mentioned history of right breast cancer who underwent a right lumpectomy, radiation, and is currently on antiestrogen therapy with anastrozole. She presents to the clinic today for follow-up.  She is tolerating anastrozole extremely well without any problems or concerns.  Denies any hot flashes or arthralgias or myalgias.  Denies any pain lumps or nodules in the breast.  ALLERGIES:  is allergic to codeine, fish allergy, penicillins, and prednisone.  MEDICATIONS:  Current Outpatient Medications  Medication Sig Dispense Refill   amLODipine (NORVASC) 5 MG tablet Take 5 mg by mouth in the morning and at bedtime.      anastrozole (ARIMIDEX) 1 MG tablet Take 1 tablet by mouth once daily 90 tablet 0   aspirin EC 81 MG tablet Take 81 mg by mouth daily. Swallow whole.     Cholecalciferol (VITAMIN D3 SUPER STRENGTH) 50 MCG (2000 UT) TABS Take 2,000 Units by mouth daily.     hydrochlorothiazide (HYDRODIURIL) 12.5 MG tablet Take 12.5 mg by mouth daily.     losartan (COZAAR) 100 MG tablet Take 100 mg by mouth daily.     metoprolol tartrate (LOPRESSOR) 25 MG tablet Take 25 mg by mouth daily.     rosuvastatin (CRESTOR) 20 MG tablet Take 20 mg by mouth daily.      No current facility-administered medications for this visit.    PHYSICAL EXAMINATION: ECOG PERFORMANCE STATUS: 1 - Symptomatic but completely ambulatory  Vitals:   04/11/21 1128  BP: (!) 181/58  Pulse: (!) 58  Resp: 18  Temp: 97.8 F (36.6 C)  SpO2: 100%   Filed Weights   04/11/21 1128  Weight: 147 lb 6.4 oz (66.9 kg)    BREAST: No palpable masses or nodules in either right or left breasts. No palpable axillary supraclavicular or infraclavicular adenopathy no breast tenderness or nipple discharge. (exam performed in the presence of a chaperone)  LABORATORY DATA:  I have reviewed the data as listed CMP Latest Ref Rng & Units 03/10/2020  Glucose 70 - 99 mg/dL 120(H)  BUN 8 - 23 mg/dL 20  Creatinine 0.44 - 1.00 mg/dL  1.31(H)  Sodium 135 - 145 mmol/L 133(L)  Potassium 3.5 - 5.1 mmol/L 3.2(L)  Chloride 98 - 111 mmol/L 95(L)  CO2 22 - 32 mmol/L 25  Calcium 8.9 - 10.3 mg/dL 10.0  Total Protein 6.5 - 8.1 g/dL 7.3  Total Bilirubin 0.3 - 1.2 mg/dL 0.8  Alkaline Phos 38 - 126 U/L 37(L)  AST 15 - 41 U/L 25  ALT 0 - 44 U/L 17    Lab Results  Component Value Date   WBC 8.9 03/10/2020   HGB 12.9 03/10/2020   HCT 38.1 03/10/2020   MCV 87.4 03/10/2020   PLT 281 03/10/2020   NEUTROABS 6.0 03/10/2020    ASSESSMENT & PLAN:  Malignant neoplasm of upper-outer quadrant of right breast in female, estrogen receptor positive ([place]) Palpable mass in the right breast 12/2019. Mammogram showed a 1.1cm mass at the 12 o'clock position in the right breast. Biopsy showed invasive ductal carcinoma, grade 2, ER+, PR-, HER-2 negative, Ki67 5%   03/17/2020: Right lumpectomy: Grade 2 IDC, 1.1 cm, margins negative, 0/4 lymph nodes negative, ER 95%, PR 15%, HER-2 equivocal by IHC, FISH negative   Treatment plan: 1.  adjuvant radiation therapy started 06/27/2020 completed 07/22/2020 2.  Followed adjuvant antiestrogen therapy with anastrozole 1 mg daily x5 years started 04/02/2020   Anastrozole Toxicities: She is tolerating anastrozole extremely well without any problems or concerns. Denies any hot flashes or myalgias.   Breast Cancer Surveillance:  1. Mammograms will be arranged in [place] in October 2022. 2. Breast Exam: 04/11/21: Benign She will do a bone density test through her orthopedic doctors.  We will try to obtain the records next year.   Return to clinic in 1 year for follow-up    No orders of the defined types were placed in this encounter.  The patient has a good understanding of the overall plan. she agrees with it. she will call with any problems that may develop before the next visit here.  Total time spent: 20 mins including face to face time and time spent for planning, charting  and coordination of care  Rulon Eisenmenger, MD, MPH 04/11/2021  I, Thana Ates, am acting as scribe for Dr. Nicholas Lose.  I have reviewed the above documentation for accuracy and completeness, and I agree with the above.

## 2021-04-10 NOTE — Assessment & Plan Note (Signed)
Palpable mass in the right breast9/2021. Mammogram showed a 1.1cm mass at the 12 o'clock position in the right breast. Biopsy showed invasive ductal carcinoma, grade 2, ER+, PR-, HER-2 negative, Ki67 5%  03/17/2020:Right lumpectomy: Grade 2 IDC, 1.1 cm, margins negative, 0/4 lymph nodes negative, ER 95%, PR 15%, HER-2 equivocal by IHC, FISH negative  Treatment plan: 1.adjuvant radiation therapy started 06/27/2020 completed 07/22/2020 2.Followedadjuvant antiestrogen therapy with anastrozole 1 mg daily x5 years started 04/02/2021  Anastrozole Toxicities: She is tolerating anastrozole extremely well without any problems or concerns. Denies any hot flashes or myalgias.  Breast Cancer Surveillance:  1. Mammograms will be arranged in [place] in October 2022. 2. Breast Exam: 04/11/21: Benign We will try to obtain a copy of her bone density from her primary care physician Dr. Marveen Reeks  Return to clinic in 1 year for follow-up

## 2021-04-11 ENCOUNTER — Other Ambulatory Visit: Payer: Self-pay

## 2021-04-11 ENCOUNTER — Inpatient Hospital Stay: Payer: Medicare Other | Attending: Hematology and Oncology | Admitting: Hematology and Oncology

## 2021-04-11 DIAGNOSIS — Z7982 Long term (current) use of aspirin: Secondary | ICD-10-CM | POA: Insufficient documentation

## 2021-04-11 DIAGNOSIS — Z170 Estrogen receptor positive status [ER+]: Secondary | ICD-10-CM | POA: Diagnosis not present

## 2021-04-11 DIAGNOSIS — C50411 Malignant neoplasm of upper-outer quadrant of right female breast: Secondary | ICD-10-CM

## 2021-04-11 DIAGNOSIS — Z923 Personal history of irradiation: Secondary | ICD-10-CM | POA: Diagnosis not present

## 2021-04-11 DIAGNOSIS — Z79899 Other long term (current) drug therapy: Secondary | ICD-10-CM | POA: Diagnosis not present

## 2021-04-11 DIAGNOSIS — Z79811 Long term (current) use of aromatase inhibitors: Secondary | ICD-10-CM | POA: Diagnosis not present

## 2021-04-11 MED ORDER — ANASTROZOLE 1 MG PO TABS
1.0000 mg | ORAL_TABLET | Freq: Every day | ORAL | 3 refills | Status: DC
Start: 2021-04-11 — End: 2022-04-11

## 2021-06-13 ENCOUNTER — Encounter (HOSPITAL_COMMUNITY): Payer: Self-pay

## 2022-04-10 NOTE — Progress Notes (Signed)
Patient Care Team: Michell Heinrich, DO as PCP - General (Family Medicine) Mauro Kaufmann, RN as Oncology Nurse Navigator Rockwell Germany, RN as Oncology Nurse Navigator  DIAGNOSIS: No diagnosis found.  SUMMARY OF ONCOLOGIC HISTORY: Oncology History  Malignant neoplasm of upper-outer quadrant of right breast in female, estrogen receptor positive ([facility])  12/2019 Initial Diagnosis   Palpated a right breast mass in 12/2019. Mammogram showed a 1.1cm mass at the 12 o'clock position in the right breast. Biopsy showed invasive ductal carcinoma, grade 2, ER+, PR-, HER-2 negative, Ki67 5%   03/17/2020 Surgery   Right lumpectomy: Grade 2 IDC, 1.1 cm, margins negative, 0/4 lymph nodes negative, ER 95%, PR 15%, HER-2 equivocal by IHC, FISH negative   04/02/2020 -  Anti-estrogen oral therapy   Anastrozole    04/13/2020 Cancer Staging   Staging form: Breast, AJCC 8th Edition - Pathologic stage from 04/13/2020: Stage IA (pT1c, pN0(sn), cM0, G2, ER+, PR+, HER2-) - Signed by Nicholas Lose, MD on 04/11/2021 Stage prefix: Initial diagnosis Method of lymph node assessment: Sentinel lymph node biopsy Multigene prognostic tests performed: None Histologic grading system: 3 grade system   04/22/2020 Genetic Testing   Negative genetic testing on the common hereditary cancer panel + RNA.  The Common Hereditary Gene Panel offered by [facility] includes sequencing and/or deletion duplication testing of the following 47 genes: APC, ATM, AXIN2, BARD1, BMPR1A, BRCA1, BRCA2, BRIP1, CDH1, CDK4, CDKN2A (p14ARF), CDKN2A (p16INK4a), CHEK2, CTNNA1, DICER1, EPCAM (Deletion/duplication testing only), GREM1 (promoter region deletion/duplication testing only), KIT, MEN1, MLH1, MSH2, MSH3, MSH6, MUTYH, NBN, NF1, NHTL1, PALB2, PDGFRA, PMS2, POLD1, POLE, PTEN, RAD50, RAD51C, RAD51D, SDHB, SDHC, SDHD, SMAD4, SMARCA4. STK11, TP53, TSC1, TSC2, and VHL.  The following genes were evaluated for sequence changes only: SDHA and HOXB13 c.251G>A  variant only. The report date is April 22, 2020.   06/27/2020 - 07/22/2020 Radiation Therapy   Adjuvant radiation at [facility] COMPLIANT: Follow-up after surgery  INTERVAL HISTORY: Julia Fernandez is a 81 y.o. female is here because of recent diagnosis of invasive ductal carcinoma of the right breast. She presents to the clinic for a follow-up.    ALLERGIES:  is allergic to codeine, fish allergy, penicillins, and prednisone.  MEDICATIONS:  Current Outpatient Medications  Medication Sig Dispense Refill   amLODipine (NORVASC) 5 MG tablet Take 5 mg by mouth in the morning and at bedtime.      anastrozole (ARIMIDEX) 1 MG tablet Take 1 tablet (1 mg total) by mouth daily. 90 tablet 3   aspirin EC 81 MG tablet Take 81 mg by mouth daily. Swallow whole.     Cholecalciferol (VITAMIN D3 SUPER STRENGTH) 50 MCG (2000 UT) TABS Take 2,000 Units by mouth daily.     hydrochlorothiazide (HYDRODIURIL) 12.5 MG tablet Take 12.5 mg by mouth daily.     losartan (COZAAR) 100 MG tablet Take 100 mg by mouth daily.     metoprolol tartrate (LOPRESSOR) 25 MG tablet Take 25 mg by mouth daily.     rosuvastatin (CRESTOR) 20 MG tablet Take 20 mg by mouth daily.      No current facility-administered medications for this visit.    PHYSICAL EXAMINATION: ECOG PERFORMANCE STATUS: {CHL ONC ECOG [phone]}  There were no vitals filed for this visit. There were no vitals filed for this visit.  BREAST:*** No palpable masses or nodules in either right or left breasts. No palpable axillary supraclavicular or infraclavicular adenopathy no breast tenderness or nipple discharge. (exam performed  in the presence of a chaperone)  LABORATORY DATA:  I have reviewed the data as listed    Latest Ref Rng & Units 03/10/2020   11:30 AM  CMP  Glucose 70 - 99 mg/dL 120   BUN 8 - 23 mg/dL 20   Creatinine 0.44 - 1.00 mg/dL 1.31   Sodium 135 - 145 mmol/L 133   Potassium 3.5 - 5.1 mmol/L 3.2   Chloride 98 - 111 mmol/L  95   CO2 22 - 32 mmol/L 25   Calcium 8.9 - 10.3 mg/dL 10.0   Total Protein 6.5 - 8.1 g/dL 7.3   Total Bilirubin 0.3 - 1.2 mg/dL 0.8   Alkaline Phos 38 - 126 U/L 37   AST 15 - 41 U/L 25   ALT 0 - 44 U/L 17     Lab Results  Component Value Date   WBC 8.9 03/10/2020   HGB 12.9 03/10/2020   HCT 38.1 03/10/2020   MCV 87.4 03/10/2020   PLT 281 03/10/2020   NEUTROABS 6.0 03/10/2020    ASSESSMENT & PLAN:  No problem-specific Assessment & Plan notes found for this encounter.    No orders of the defined types were placed in this encounter.  The patient has a good understanding of the overall plan. she agrees with it. she will call with any problems that may develop before the next visit here. Total time spent: 30 mins including face to face time and time spent for planning, charting and co-ordination of care   Suzzette Righter, [place] 04/10/22    I Gardiner Coins am acting as a Education administrator for [employer]  ***

## 2022-04-11 ENCOUNTER — Inpatient Hospital Stay: Payer: Medicare Other | Attending: Hematology and Oncology | Admitting: Hematology and Oncology

## 2022-04-11 ENCOUNTER — Encounter: Payer: Self-pay | Admitting: *Deleted

## 2022-04-11 VITALS — BP 185/57 | HR 61 | Temp 97.5°F | Resp 18 | Ht 63.0 in | Wt 144.6 lb

## 2022-04-11 DIAGNOSIS — C50411 Malignant neoplasm of upper-outer quadrant of right female breast: Secondary | ICD-10-CM

## 2022-04-11 DIAGNOSIS — Z79811 Long term (current) use of aromatase inhibitors: Secondary | ICD-10-CM | POA: Diagnosis not present

## 2022-04-11 DIAGNOSIS — Z170 Estrogen receptor positive status [ER+]: Secondary | ICD-10-CM | POA: Diagnosis not present

## 2022-04-11 MED ORDER — ANASTROZOLE 1 MG PO TABS: 1.0000 mg | ORAL_TABLET | Freq: Every day | ORAL | 3 refills | Status: DC

## 2022-04-11 NOTE — Assessment & Plan Note (Addendum)
Palpable mass in the right breast 12/2019. Mammogram showed a 1.1cm mass at the 12 o'clock position in the right breast. Biopsy showed invasive ductal carcinoma, grade 2, ER+, PR-, HER-2 negative, Ki67 5%   03/17/2020: Right lumpectomy: Grade 2 IDC, 1.1 cm, margins negative, 0/4 lymph nodes negative, ER 95%, PR 15%, HER-2 equivocal by IHC, FISH negative   Treatment plan: 1.  adjuvant radiation therapy started 06/27/2020 completed 07/22/2020 2.  Followed adjuvant antiestrogen therapy with anastrozole 1 mg daily x5 years started 04/02/2020   Anastrozole Toxicities: She is tolerating anastrozole extremely well without any problems or concerns. Denies any hot flashes or myalgias.   Breast Cancer Surveillance:  1. Mammograms 01/30/2022: [place]: Benign breast density category B. 2. Breast Exam: 04/11/22: Benign She did a bone density test through her orthopedic doctors.  We will try to obtain the records     Return to clinic in 1 year for follow-up

## 2022-09-06 ENCOUNTER — Encounter: Payer: Self-pay | Admitting: *Deleted

## 2022-09-06 NOTE — Progress Notes (Signed)
Received call from Huntley Dec, radiation RN at [facility] requesting recent office notes be faxed.  RN successfully faxed notes to [phone].

## 2023-04-17 ENCOUNTER — Inpatient Hospital Stay: Payer: Medicare Other | Attending: Hematology and Oncology | Admitting: Hematology and Oncology

## 2023-04-17 VITALS — BP 180/51 | HR 68 | Temp 97.9°F | Resp 18 | Ht 63.0 in | Wt 143.8 lb

## 2023-04-17 DIAGNOSIS — C50411 Malignant neoplasm of upper-outer quadrant of right female breast: Secondary | ICD-10-CM | POA: Diagnosis not present

## 2023-04-17 DIAGNOSIS — Z170 Estrogen receptor positive status [ER+]: Secondary | ICD-10-CM | POA: Diagnosis not present

## 2023-04-17 DIAGNOSIS — Z79811 Long term (current) use of aromatase inhibitors: Secondary | ICD-10-CM | POA: Diagnosis not present

## 2023-04-17 MED ORDER — ANASTROZOLE 1 MG PO TABS: 1.0000 mg | ORAL_TABLET | Freq: Every day | ORAL | 3 refills | Status: DC

## 2023-04-17 NOTE — Progress Notes (Signed)
Patient Care Team: Renaldo Harrison, DO as PCP - General (Family Medicine) Pershing Proud, RN as Oncology Nurse Navigator Donnelly Angelica, RN as Oncology Nurse Navigator  DIAGNOSIS:  Encounter Diagnosis  Name Primary?   Malignant neoplasm of upper-outer quadrant of right breast in female, estrogen receptor positive ([facility]) Yes    SUMMARY OF ONCOLOGIC HISTORY: Oncology History  Malignant neoplasm of upper-outer quadrant of right breast in female, estrogen receptor positive ([facility])  12/2019 Initial Diagnosis   Palpated a right breast mass in 12/2019. Mammogram showed a 1.1cm mass at the 12 o'clock position in the right breast. Biopsy showed invasive ductal carcinoma, grade 2, ER+, PR-, HER-2 negative, Ki67 5%   03/17/2020 Surgery   Right lumpectomy: Grade 2 IDC, 1.1 cm, margins negative, 0/4 lymph nodes negative, ER 95%, PR 15%, HER-2 equivocal by IHC, FISH negative   04/02/2020 -  Anti-estrogen oral therapy   Anastrozole    04/13/2020 Cancer Staging   Staging form: Breast, AJCC 8th Edition - Pathologic stage from 04/13/2020: Stage IA (pT1c, pN0(sn), cM0, G2, ER+, PR+, HER2-) - Signed by Serena Croissant, MD on 04/11/2021 Stage prefix: Initial diagnosis Method of lymph node assessment: Sentinel lymph node biopsy Multigene prognostic tests performed: None Histologic grading system: 3 grade system   04/22/2020 Genetic Testing   Negative genetic testing on the common hereditary cancer panel + RNA.  The Common Hereditary Gene Panel offered by [facility] includes sequencing and/or deletion duplication testing of the following 47 genes: APC, ATM, AXIN2, BARD1, BMPR1A, BRCA1, BRCA2, BRIP1, CDH1, CDK4, CDKN2A (p14ARF), CDKN2A (p16INK4a), CHEK2, CTNNA1, DICER1, EPCAM (Deletion/duplication testing only), GREM1 (promoter region deletion/duplication testing only), KIT, MEN1, MLH1, MSH2, MSH3, MSH6, MUTYH, NBN, NF1, NHTL1, PALB2, PDGFRA, PMS2, POLD1, POLE, PTEN, RAD50, RAD51C, RAD51D, SDHB, SDHC, SDHD, SMAD4,  SMARCA4. STK11, TP53, TSC1, TSC2, and VHL.  The following genes were evaluated for sequence changes only: SDHA and HOXB13 c.251G>A variant only. The report date is April 22, 2020.   06/27/2020 - 07/22/2020 Radiation Therapy   Adjuvant radiation at [facility]     CHIEF COMPLIANT: Follow-up on anastrozole therapy  HISTORY OF PRESENT ILLNESS:   History of Present Illness   The patient, with a history of breast cancer, presents for a routine follow-up. She has been on anastrozole for three years without any side effects, including hot flashes. She denies any breast pain or discomfort and has been keeping up with annual mammograms.  The patient also reports high blood pressure readings at the doctor's office, which she attributes to anxiety. She has been advised to monitor her blood pressure at home and record the readings.  In addition, the patient mentions a recent change in her blood pressure medication, which she did not tolerate well. As a result, she was put back on hydrochlorothiazide (HTZ) and losartan.  The patient also discusses her exercise habits, noting that she enjoys walking but has been less active due to cold weather. She expresses interest in a gym membership, which may be covered by her insurance.         ALLERGIES:  is allergic to codeine, fish allergy, hydralazine, latex, penicillins, and prednisone.  MEDICATIONS:  Current Outpatient Medications  Medication Sig Dispense Refill   amLODipine (NORVASC) 5 MG tablet Take 5 mg by mouth in the morning and at bedtime.      anastrozole (ARIMIDEX) 1 MG tablet Take 1 tablet (1 mg total) by mouth daily. 90 tablet 3   aspirin EC 81 MG tablet Take 81 mg by  mouth daily. Swallow whole.     Cholecalciferol (VITAMIN D3 SUPER STRENGTH) 50 MCG (2000 UT) TABS Take 2,000 Units by mouth daily.     hydrochlorothiazide (HYDRODIURIL) 12.5 MG tablet Take 12.5 mg by mouth daily.     losartan (COZAAR) 100 MG tablet Take 100 mg by mouth daily.      metoprolol tartrate (LOPRESSOR) 25 MG tablet Take 25 mg by mouth daily.     rosuvastatin (CRESTOR) 20 MG tablet Take 20 mg by mouth daily.      No current facility-administered medications for this visit.    PHYSICAL EXAMINATION: ECOG PERFORMANCE STATUS: 1 - Symptomatic but completely ambulatory  Vitals:   04/17/23 1115  BP: (!) 180/51  Pulse: 68  Resp: 18  Temp: 97.9 F (36.6 C)  SpO2: 98%   Filed Weights   04/17/23 1115  Weight: 143 lb 12.8 oz (65.2 kg)      LABORATORY DATA:  I have reviewed the data as listed    Latest Ref Rng & Units 03/10/2020   11:30 AM  CMP  Glucose 70 - 99 mg/dL 147   BUN 8 - 23 mg/dL 20   Creatinine 8.29 - 1.00 mg/dL 5.62   Sodium 130 - 865 mmol/L 133   Potassium 3.5 - 5.1 mmol/L 3.2   Chloride 98 - 111 mmol/L 95   CO2 22 - 32 mmol/L 25   Calcium 8.9 - 10.3 mg/dL 78.4   Total Protein 6.5 - 8.1 g/dL 7.3   Total Bilirubin 0.3 - 1.2 mg/dL 0.8   Alkaline Phos 38 - 126 U/L 37   AST 15 - 41 U/L 25   ALT 0 - 44 U/L 17     Lab Results  Component Value Date   WBC 8.9 03/10/2020   HGB 12.9 03/10/2020   HCT 38.1 03/10/2020   MCV 87.4 03/10/2020   PLT 281 03/10/2020   NEUTROABS 6.0 03/10/2020    ASSESSMENT & PLAN:  Malignant neoplasm of upper-outer quadrant of right breast in female, estrogen receptor positive ([facility]) Palpable mass in the right breast 12/2019. Mammogram showed a 1.1cm mass at the 12 o'clock position in the right breast. Biopsy showed invasive ductal carcinoma, grade 2, ER+, PR-, HER-2 negative, Ki67 5%   03/17/2020: Right lumpectomy: Grade 2 IDC, 1.1 cm, margins negative, 0/4 lymph nodes negative, ER 95%, PR 15%, HER-2 equivocal by IHC, FISH negative   Treatment plan: 1.  adjuvant radiation therapy started 06/27/2020 completed 07/22/2020 2.  Followed adjuvant antiestrogen therapy with anastrozole 1 mg daily x5 years started 04/02/2020   Anastrozole Toxicities: She is tolerating anastrozole extremely well without any problems  or concerns. Denies any hot flashes or myalgias.   Breast Cancer Surveillance:  1. Mammograms 02/07/2023: [place]: Benign breast density category B. 2. Breast Exam: 04/17/2023: Benign She did a bone density test through her orthopedic doctors.  We will try to obtain the records     Return to clinic in 1 year for follow-up    General Health Maintenance Encouraged physical activity, discussed potential for gym membership through insurance. -Continue to encourage physical activity. -Check insurance for potential gym membership benefits.  Follow-up Annual follow-up planned for breast cancer surveillance. -Schedule follow-up appointment in one year.          No orders of the defined types were placed in this encounter.  The patient has a good understanding of the overall plan. she agrees with it. she will call with any problems that may develop before the next  visit here. Total time spent: 30 mins including face to face time and time spent for planning, charting and co-ordination of care   Tamsen Meek, MD 04/17/23

## 2023-04-17 NOTE — Assessment & Plan Note (Signed)
Palpable mass in the right breast 12/2019. Mammogram showed a 1.1cm mass at the 12 o'clock position in the right breast. Biopsy showed invasive ductal carcinoma, grade 2, ER+, PR-, HER-2 negative, Ki67 5%   03/17/2020: Right lumpectomy: Grade 2 IDC, 1.1 cm, margins negative, 0/4 lymph nodes negative, ER 95%, PR 15%, HER-2 equivocal by IHC, FISH negative   Treatment plan: 1.  adjuvant radiation therapy started 06/27/2020 completed 07/22/2020 2.  Followed adjuvant antiestrogen therapy with anastrozole 1 mg daily x5 years started 04/02/2020   Anastrozole Toxicities: She is tolerating anastrozole extremely well without any problems or concerns. Denies any hot flashes or myalgias.   Breast Cancer Surveillance:  1. Mammograms 02/07/2023: [place]: Benign breast density category B. 2. Breast Exam: 04/17/2023: Benign She did a bone density test through her orthopedic doctors.  We will try to obtain the records     Return to clinic in 1 year for follow-up

## 2023-11-26 ENCOUNTER — Other Ambulatory Visit: Payer: Self-pay | Admitting: Hematology and Oncology

## 2023-11-26 MED ORDER — ANASTROZOLE 1 MG PO TABS: 1.0000 mg | ORAL_TABLET | Freq: Every day | ORAL | 4 refills | Status: DC

## 2023-11-26 MED ORDER — ANASTROZOLE 1 MG PO TABS: 1.0000 mg | ORAL_TABLET | Freq: Every day | ORAL | 4 refills | Status: AC

## 2024-04-16 ENCOUNTER — Ambulatory Visit: Payer: Medicare Other | Admitting: Hematology and Oncology

## 2024-08-05 ENCOUNTER — Ambulatory Visit: Admitting: Hematology and Oncology
